# Patient Record
Sex: Male | Born: 1945 | Race: Black or African American | Hispanic: No | Marital: Married | Smoking: Former smoker
Health system: Southern US, Community
[De-identification: ages and names within clinical notes are randomized; demographics above are authoritative.]

## PROBLEM LIST (undated history)

## (undated) DIAGNOSIS — I739 Peripheral vascular disease, unspecified: Secondary | ICD-10-CM

## (undated) DIAGNOSIS — M549 Dorsalgia, unspecified: Secondary | ICD-10-CM

## (undated) DIAGNOSIS — G629 Polyneuropathy, unspecified: Secondary | ICD-10-CM

## (undated) DIAGNOSIS — F419 Anxiety disorder, unspecified: Secondary | ICD-10-CM

## (undated) DIAGNOSIS — Z860100 Personal history of colon polyps, unspecified: Secondary | ICD-10-CM

## (undated) DIAGNOSIS — M1990 Unspecified osteoarthritis, unspecified site: Secondary | ICD-10-CM

## (undated) DIAGNOSIS — I714 Abdominal aortic aneurysm, without rupture, unspecified: Secondary | ICD-10-CM

## (undated) DIAGNOSIS — E785 Hyperlipidemia, unspecified: Secondary | ICD-10-CM

## (undated) DIAGNOSIS — Z8709 Personal history of other diseases of the respiratory system: Secondary | ICD-10-CM

## (undated) DIAGNOSIS — G8929 Other chronic pain: Secondary | ICD-10-CM

## (undated) DIAGNOSIS — R0602 Shortness of breath: Secondary | ICD-10-CM

## (undated) DIAGNOSIS — K219 Gastro-esophageal reflux disease without esophagitis: Secondary | ICD-10-CM

## (undated) DIAGNOSIS — F329 Major depressive disorder, single episode, unspecified: Secondary | ICD-10-CM

## (undated) DIAGNOSIS — R569 Unspecified convulsions: Secondary | ICD-10-CM

## (undated) DIAGNOSIS — J189 Pneumonia, unspecified organism: Secondary | ICD-10-CM

## (undated) DIAGNOSIS — K759 Inflammatory liver disease, unspecified: Secondary | ICD-10-CM

## (undated) DIAGNOSIS — I10 Essential (primary) hypertension: Secondary | ICD-10-CM

## (undated) DIAGNOSIS — F32A Depression, unspecified: Secondary | ICD-10-CM

## (undated) DIAGNOSIS — J439 Emphysema, unspecified: Secondary | ICD-10-CM

## (undated) DIAGNOSIS — J449 Chronic obstructive pulmonary disease, unspecified: Secondary | ICD-10-CM

## (undated) DIAGNOSIS — J45909 Unspecified asthma, uncomplicated: Secondary | ICD-10-CM

## (undated) DIAGNOSIS — M2550 Pain in unspecified joint: Secondary | ICD-10-CM

## (undated) DIAGNOSIS — Z8719 Personal history of other diseases of the digestive system: Secondary | ICD-10-CM

## (undated) DIAGNOSIS — Z8619 Personal history of other infectious and parasitic diseases: Secondary | ICD-10-CM

## (undated) DIAGNOSIS — Z8711 Personal history of peptic ulcer disease: Secondary | ICD-10-CM

## (undated) HISTORY — DX: Abdominal aortic aneurysm, without rupture: I71.4

## (undated) HISTORY — DX: Peripheral vascular disease, unspecified: I73.9

## (undated) HISTORY — DX: Abdominal aortic aneurysm, without rupture, unspecified: I71.40

## (undated) HISTORY — PX: ANKLE SURGERY: SHX546

## (undated) HISTORY — DX: Essential (primary) hypertension: I10

## (undated) HISTORY — DX: Chronic obstructive pulmonary disease, unspecified: J44.9

## (undated) HISTORY — DX: Hyperlipidemia, unspecified: E78.5

## (undated) HISTORY — PX: COLONOSCOPY: SHX174

---

## 1990-01-23 HISTORY — PX: ROTATOR CUFF REPAIR: SHX139

## 1999-07-01 ENCOUNTER — Emergency Department (HOSPITAL_COMMUNITY): Admission: EM | Admit: 1999-07-01 | Discharge: 1999-07-01 | Payer: Self-pay | Admitting: Emergency Medicine

## 1999-07-01 ENCOUNTER — Encounter: Payer: Self-pay | Admitting: Emergency Medicine

## 2006-01-23 DIAGNOSIS — J189 Pneumonia, unspecified organism: Secondary | ICD-10-CM

## 2006-01-23 DIAGNOSIS — Z8709 Personal history of other diseases of the respiratory system: Secondary | ICD-10-CM

## 2006-01-23 HISTORY — DX: Personal history of other diseases of the respiratory system: Z87.09

## 2006-01-23 HISTORY — DX: Pneumonia, unspecified organism: J18.9

## 2006-07-18 ENCOUNTER — Ambulatory Visit: Payer: Self-pay | Admitting: Gastroenterology

## 2006-08-17 ENCOUNTER — Ambulatory Visit: Payer: Self-pay | Admitting: Gastroenterology

## 2006-09-05 ENCOUNTER — Encounter: Payer: Self-pay | Admitting: Gastroenterology

## 2006-09-05 ENCOUNTER — Ambulatory Visit: Payer: Self-pay | Admitting: Gastroenterology

## 2006-09-05 ENCOUNTER — Ambulatory Visit (HOSPITAL_COMMUNITY): Admission: RE | Admit: 2006-09-05 | Discharge: 2006-09-05 | Payer: Self-pay | Admitting: Gastroenterology

## 2009-02-08 ENCOUNTER — Ambulatory Visit: Payer: Self-pay | Admitting: Surgery

## 2010-02-21 ENCOUNTER — Ambulatory Visit
Admission: RE | Admit: 2010-02-21 | Discharge: 2010-02-21 | Payer: Self-pay | Source: Home / Self Care | Attending: Surgery | Admitting: Surgery

## 2010-02-21 ENCOUNTER — Ambulatory Visit: Admit: 2010-02-21 | Payer: Self-pay | Admitting: Surgery

## 2010-02-22 NOTE — Assessment & Plan Note (Signed)
OFFICE VISIT  [name], [name] DOB:  Dec 09, 1945                                       02/21/2010 ZOXWR#:[id number]  The patient comes back in today for follow-up of his abdominal aortic aneurysm.  This was initially detected via MRI.  He denies having any symptoms.  He is complaining of some reflux-type problems as well as some neuropathic pain in his right flank radiating down his right leg but no specific complaints that relate to his aneurysm.  He continues to smoke 1 pack of cigarettes which lasts him 2 weeks.  PHYSICAL EXAMINATIONS:  Heart rate 61, blood pressure 189/97, temperature is 97.  General:  He is well-appearing, in no distress. HEENT:  Within normal limits.  Cardiovascular:  He has palpable pedal pulses.  Abdomen:  Soft, nontender.  No pulsatile mass. Musculoskeletal:  Without major deformities.  Skin:  Without rash.  DIAGNOSTIC STUDIES:  Ultrasound was performed today which reveals a slight increase in the size of his aneurysm.  Two years ago it measured 3.7, this year it measures 4.3.  ASSESSMENT:  Infrarenal abdominal aortic aneurysm.  PLAN:  The patient's aneurysm is growing approximately 3 mm per year.  I will plan on seeing him back in a year.  At that time I would like to get a baseline CT scan to make sure that it correlates with our ultrasound measurements.  In addition, we will get a CT scan of his chest to make sure there is not additional aortic pathology above the diaphragm.  Again, I will plan on seeing him back in the year.    Jorge Ny, MD Electronically Signed  VWB/MEDQ  D:  02/21/2010  T:  02/22/2010  Job:  [id number]  cc:   Lewanda Rife

## 2010-03-07 NOTE — Procedures (Unsigned)
DUPLEX ULTRASOUND OF ABDOMINAL AORTA  INDICATION:  Abdominal aortic aneurysm.  HISTORY: Diabetes:  No Cardiac:  No Hypertension:  Yes Smoking:  Previous Previous Surgery:  No Family History:  No.  DUPLEX EXAM:         AP (cm)                   TRANSVERSE (cm) Proximal             2.9 cm                    2.9 cm Mid                  not visualized            not visualized Distal               4.3 cm                    4.3 cm Right Iliac          not visualized            not visualized Left Iliac           not visualized            not visualized  PREVIOUS:  Date: 12/22/2008 (MRI)  AP:  3.7  TRANSVERSE:  3.7  IMPRESSION: 1. Aneurysmal dilatation of the distal abdominal aorta noted, based on     limited visualization. 2. Mild increase in the maximum diameter measurement of the distal     abdominal aorta noted when compared to the previous MRI. 3. Unable to adequately visualize the mid abdominal aorta and     bilateral common iliac arteries due to overlying bowel gas     patterns.  ___________________________________________ V. Charlena Cross, MD  CH/MEDQ  D:  02/21/2010  T:  02/21/2010  Job:  [id number]
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
## 2010-06-07 NOTE — Assessment & Plan Note (Signed)
OFFICE VISIT   JEYDAN, BARNER  DOB:  02/16/1945                                       02/08/2009  ZOXWR#:[id number]   REFERRING PHYSICIAN:  Ernestine Conrad, MD   REASON FOR VISIT:  Abdominal aortic aneurysm.   HISTORY:  The patient is a 65 year old gentleman I am seeing at the  request of Dr. Ernestine Conrad for evaluation of abdominal aortic aneurysm.  The patient presented with abnormal gait and muscle weakness on the  right side.  He underwent an MRI to evaluate his back and this revealed  a 3.7-cm infrarenal abdominal aortic aneurysm.  It had increased in size  since 2008 where it measured 3.1 cm.  The patient denies having any  symptoms of abdominal pain.  He does complain of some weakness which is  associated with his back pain.  He denies claudication.   The patient has a history of hypertension and hypercholesterolemia which  are medically managed.  He is a former smoker, but quit in June 2009.   REVIEW OF SYSTEMS:  GENERAL:  Negative.  CARDIAC:  Positive shortness of breath.  PULMONARY:  Negative.  GI:  Positive for reflux.  GU:  Negative.  VASCULAR:  Positive for pain in legs with walking and lying flat.  NEURO:  Negative.  MUSCULOSKELETAL:  Positive joint pain.  PSYCH:  Negative.  HEENT:  Negative.  HEMATOLOGIC:  Negative.  SKIN:  Negative.   PAST MEDICAL HISTORY:  Hypertension, hypercholesterolemia, COPD.   PAST SURGICAL HISTORY:  Rotator cuff surgery and cyst removal.   FAMILY HISTORY:  Negative for cardiovascular disease at an early age.   SOCIAL HISTORY:  He has 3 children.  He is disabled.  Does not smoke.  Has a history of smoking and quit in 2009.   ALLERGIES:  SULFA.   PHYSICAL EXAMINATION:  Heart rate 71, blood pressure 157/102, O2 sats  are 99%, temperature is 97.9.  General:  Well-appearing, no distress.  HEENT is normal.  Neck is supple.  No JVD.  Lungs:  Clear bilaterally.  Cardiovascular:  Regular rate and rhythm.  He has  palpable pedal pulses  bilaterally and carotids are without bruit.  Abdomen:  Soft, nontender,  no hepatosplenomegaly.  Musculoskeletal:  Without major deformities or  cyanosis.  Neurologically, he is nonfocal.  Neurologically, he is  intact.  Skin is without rash.  Psychological:  He has a normal affect.   DIAGNOSTIC STUDIES:  The patient comes with a report of his MRI which  shows a 3.7-cm infrarenal abdominal aortic aneurysm.   ASSESSMENT:  Abdominal aortic aneurysm.   PLAN:  Based on the size of the patient's aneurysm, I think he is a very  low risk for rupture.  However, I do think this needs to be followed  over time.  I have scheduled the patient to come back in 6 months for an  abdominal ultrasound and see a PA at that time.  I will plan on seeing  him in 1 year.  When his aorta gets to be greater than 4 cm, I would get  a baseline CT angiogram for future planning.  All of the patient's  questions were answered today.     Jorge Ny, MD  Electronically Signed   VWB/MEDQ  D:  02/08/2009  T:  02/09/2009  Job:  [id number]  cc:   Ernestine Conrad, MD

## 2010-06-07 NOTE — Op Note (Signed)
[name], [name]                  ACCOUNT NO.:  [id number]   MEDICAL RECORD NO.:  [id number]          PATIENT TYPE:  AMB   LOCATION:  DAY                           FACILITY:  [facility]   PHYSICIAN:  Kassie Mends, M.D.      DATE OF BIRTH:  07/09/1945   DATE OF PROCEDURE:  09/05/2006  DATE OF DISCHARGE:                               OPERATIVE REPORT   PROCEDURE:  1. Colonoscopy with cold forceps polypectomy.  2. Esophagogastroduodenoscopy with cold forceps biopsy.   INDICATION FOR EXAM:  Mr. Truss is a 65 year old male who has a  personal history of polyps.  His last colonoscopy showed an adenomatous  polyp with high grade dysplasia in 2002.  He is also complaining of pain  in his abdomen in the epigastric region.  He denies any vomiting.  He  has also had a 12-pound weight loss.   FINDINGS:  1. 4 mm transverse colon polyp removed via cold forceps.  4 mm cecal      polyp removed via cold forceps.  2. Pan colonic diverticulosis, left side predominant.  Otherwise no      masses, inflammatory changes or AVMs seen.  3. Normal retroflexed view of the rectum.  4. Normal esophagus without evidence of Barrett's erosions,      ulceration, or mass.  5. Sliding hiatal hernia.  Diffuse antral erythema without erosion or      ulceration.  Biopsies obtained via cold forceps to evaluate for H      pylori gastritis.  6. Normal duodenal bulb and second portion of duodenum.   RECOMMENDATIONS:  1. Mr. Sebesta should avoid gastric irritants.  He is given a handout      on gastritis as well as gastric irritants.  2. Screening colonoscopy in 5 years.  3. No aspirin, NSAIDs or anticoagulation for 5 days.  4. He should follow high fiber diet.  He is given a handout on high-      fiber diet, constipation, polyps, diverticulosis, gastritis and      gastric irritants.  5. Will call Mr. Wisenbaker with the results of his biopsies.   MEDICATIONS:  1. Demerol 125 mg IV.  2. Versed 7 mg IV.  3. Phenergan 25 mg  IV.   PROCEDURE TECHNIQUE:  Physical exam was performed.  Informed consent was  obtained from the patient after explaining the benefits, risks and  alternatives to the procedure.  The patient connected to monitor placed  in left lateral position.  Continuous oxygen was provided by nasal  cannula, IV medicine administered through an indwelling cannula.  After  administration of sedation and rectal exam, the patient's rectum was  intubated.  The scope was advanced under direct visualization to the  cecum.  The scope was removed slowly by carefully examining the color,  texture, anatomy and integrity mucosa on the way out.   After the colonoscopy, the patient's esophagus intubated with a  diagnostic gastroscope and advanced under direct visualization to second  portion of duodenum.  Scope was removed slowly by carefully examine the  color, texture, anatomy and  integrity mucosa on the way out.  The  patient needed to be recovered in PACU due to continued sedation after  the procedure.  However, during the upper endoscopy and colonoscopy, he  was extremely agitated.  The patient was fully recovered and discharged  home in satisfactory condition.      Kassie Mends, M.D.  Electronically Signed     SM/MEDQ  D:  09/05/2006  T:  09/06/2006  Job:  [id number]   cc:   Selinda Flavin  Fax: [phone]

## 2010-06-07 NOTE — Assessment & Plan Note (Signed)
NAMEMarland Kitchen  FREDERICK, MARRO                   CHART#:  [id number]   DATE:  08/17/2006                       DOB:  [birth year]   PROBLEM LIST:  1. Abdominal pain and constipation.  2. Personal history of polyps in 2002.  3. 3.1 cm abdominal aneurysm, infrarenal.  4. Chronic obstructive pulmonary disease.  5. Anxiety.  6. Back pain.  7. Insomnia.  8. 3mm lung nodule   SUBJECTIVE:  Mr. Senkbeil is a 65 year old male who is seen today as a  return patient visit. He states that he is feeling pretty good and  eating better. He is going to the bathroom better. His heartburn is  controlled with Nexium. He is only having heartburn when he cheats. His  difficulty urinating is resolved with Proscar. His abdominal pain is  improved. He also gained 5.5 pounds since his initial visit on  07/18/2006.   MEDICATIONS:  1. Xanax 1 mg q.i.d.  2. Finasteride 5 mg daily.  3. Diazepam 10 mg 1.5 nightly.  4. Nexium 40 mg daily.  5. Duragesic patch 25 mcg every 72 hours.  6. Advair.  7. Gabapentin 300 mg t.i.d.  8. MiraLax once weekly.  9. Metamucil 3 times daily.   OBJECTIVE:  VITAL SIGNS:  Weight 156 pounds, height 5 foot 11 inches,  BMI 21.8 (healthy), temperature 98.3, blood pressure 102/78, pulse 64.  GENERAL:  He is in no apparent distress. He is awake, alert, and  oriented x4.  LUNGS:  Clear to auscultation bilaterally.  CARDIOVASCULAR:  Regular rhythm with no murmur. Normal S1 and S2.  ABDOMEN:  Bowel sounds are present. Soft and nontender, nondistended. No  rebound or guarding.  EXTREMITIES:  Without cyanosis, clubbing, or edema.   LABORATORY DATA:  July 23, 2006: BUN 11, creatinine 1.04, alkaline  phosphatase 143, total bilirubin 0.6, AST 16, ALT 14, white count 6.6,  hemoglobin 13.5, AFP less than 1, lipase 15.   CT scan performed on June 30 at [facility] revealed no  acute intraabdominal findings. He had a few hepatic and renal cysts. He  is also known to have a 3.1 cm  abdominal aneurysm. He also had a 3 mm  nodule on the right base.   ASSESSMENT:  Mr. Fehringer is a 65 year old male who has abdominal pain  most likely secondary to his constipation. His gastroesophageal reflux  disease is now controlled on Nexium. He has a personal history of  polyps. He has a lung nodule on CT Scan that needs to be followed  yearly.   Thank you for allowing me to see Mr. Kliethermes in consultation. My  recommendations follow.   RECOMMENDATIONS:  1. We will schedule Mr. Crutchfield for a colonoscopy within the next 2      weeks with EZ prep.  2. He should continue his Nexium 40 mg daily.  3. He should continue his MiraLax and his Metamucil for his      constipation.  4. He will be given a referral to a vascular surgeon for his abdominal      aortic aneurysm.  5. Radiology recommended a follow up CT scan of the chest in one year.       Kassie Mends, M.D.  Electronically Signed     SM/MEDQ  D:  08/17/2006  T:  08/18/2006  Job:  [id number]   cc:   Selinda Flavin

## 2010-11-21 ENCOUNTER — Other Ambulatory Visit: Payer: Self-pay | Admitting: *Deleted

## 2010-11-21 DIAGNOSIS — I714 Abdominal aortic aneurysm, without rupture: Secondary | ICD-10-CM

## 2010-11-21 DIAGNOSIS — I7092 Chronic total occlusion of artery of the extremities: Secondary | ICD-10-CM

## 2011-02-22 ENCOUNTER — Encounter: Payer: Self-pay | Admitting: Surgery

## 2011-02-27 ENCOUNTER — Inpatient Hospital Stay: Admission: RE | Admit: 2011-02-27 | Payer: Self-pay | Source: Ambulatory Visit

## 2011-02-27 ENCOUNTER — Ambulatory Visit: Payer: Self-pay | Admitting: Surgery

## 2011-02-27 ENCOUNTER — Other Ambulatory Visit: Payer: Self-pay

## 2011-03-01 ENCOUNTER — Encounter: Payer: Self-pay | Admitting: Family Medicine

## 2011-03-17 ENCOUNTER — Encounter: Payer: Self-pay | Admitting: Surgery

## 2011-03-20 ENCOUNTER — Ambulatory Visit
Admission: RE | Admit: 2011-03-20 | Discharge: 2011-03-20 | Disposition: A | Discharge status: Home / Self Care | Payer: No Typology Code available for payment source | Source: Ambulatory Visit | Attending: Surgery | Admitting: Surgery

## 2011-03-20 ENCOUNTER — Other Ambulatory Visit: Payer: Self-pay | Admitting: Surgery

## 2011-03-20 ENCOUNTER — Encounter: Payer: Self-pay | Admitting: Surgery

## 2011-03-20 ENCOUNTER — Ambulatory Visit (INDEPENDENT_AMBULATORY_CARE_PROVIDER_SITE_OTHER): Payer: Medicare Other | Admitting: Surgery

## 2011-03-20 VITALS — BP 167/98 | HR 70 | Resp 16 | Ht 71.0 in | Wt 172.0 lb

## 2011-03-20 DIAGNOSIS — I714 Abdominal aortic aneurysm, without rupture, unspecified: Secondary | ICD-10-CM | POA: Insufficient documentation

## 2011-03-20 DIAGNOSIS — I7092 Chronic total occlusion of artery of the extremities: Secondary | ICD-10-CM

## 2011-03-20 MED ORDER — IOHEXOL 350 MG/ML SOLN
100.0000 mL | Freq: Once | INTRAVENOUS | Status: AC | PRN
  Administered 2011-03-20: 100 mL via INTRAVENOUS

## 2011-03-20 NOTE — Progress Notes (Signed)
Addended by: Holley Raring on: 03/20/2011 02:02 PM   Modules accepted: Orders

## 2011-03-20 NOTE — Progress Notes (Signed)
[facility]   Patient name: [name]. MRN: [id number] DOB: [birth year] Sex: male     Chief Complaint  Patient presents with  . AAA    one year f/up -Abdominal  discomfort duration6 month    HISTORY OF PRESENT ILLNESS: The patient comes back today for followup of his abdominal aortic aneurysm. This was initially detected by MRI when he was being evaluated for an abnormal gait. Last year it measured 4.3 cm. 2 years prior it measured 3.7 cm. He denies symptoms of abdominal or back pain. He denies neurologic symptoms of numbness or weakness in either extremity or amaurosis or slurred speech. He continues to suffer from his COPD and has been changing his inhalers around. He seems to be stable at this time.  Past Medical History  Diagnosis Date  . Hypertension   . Hyperlipidemia   . COPD (chronic obstructive pulmonary disease)   . Peripheral vascular disease   . AAA (abdominal aortic aneurysm)     Past Surgical History  Procedure Date  . Rotator cuff repair     History   Social History  . Marital Status: Married    Spouse Name: N/A    Number of Children: N/A  . Years of Education: N/A   Occupational History  . Not on file.   Social History Main Topics  . Smoking status: Former Smoker    Quit date: 06/24/2007  . Smokeless tobacco: Not on file  . Alcohol Use: No  . Drug Use: No  . Sexually Active:    Other Topics Concern  . Not on file   Social History Narrative  . No narrative on file    History reviewed. No pertinent family history.  Allergies as of 03/20/2011 - Review Complete 03/20/2011  Allergen Reaction Noted  . Sulfa antibiotics  02/22/2011    Current Outpatient Prescriptions on File Prior to Visit  Medication Sig Dispense Refill  . doxazosin (CARDURA) 4 MG tablet Take 4 mg by mouth at bedtime.      Marland Kitchen esomeprazole (NEXIUM) 40 MG capsule Take 40 mg by mouth daily before breakfast.      . Fluticasone-Salmeterol (ADVAIR)  100-50 MCG/DOSE AEPB Inhale 1 puff into the lungs every 12 (twelve) hours.      . gabapentin (NEURONTIN) 300 MG capsule Take 300 mg by mouth 3 (three) times daily.      Marland Kitchen lisinopril (PRINIVIL,ZESTRIL) 10 MG tablet Take 10 mg by mouth daily.      Marland Kitchen loratadine (CLARITIN) 10 MG tablet Take 10 mg by mouth daily.      . simvastatin (ZOCOR) 20 MG tablet Take 20 mg by mouth every evening.       No current facility-administered medications on file prior to visit.     REVIEW OF SYSTEMS: Occasional shortness of breath, chest pressure, pain in legs with walking and lying flat, productive cough, asthma, wheezing. All other review of systems are negative. PHYSICAL EXAMINATION:   Vital signs are BP 167/98  Pulse 70  Resp 16  Ht 5\' 11"  (1.803 m)  Wt 172 lb (78.019 kg)  BMI 23.99 kg/m2  SpO2 96% General: The patient appears their stated age. HEENT:  No gross abnormalities Pulmonary:  Non labored breathing Abdomen: Soft and non-tender no pulsatile mass tenderness. Musculoskeletal: There are no major deformities. Neurologic: No focal weakness or paresthesias are detected, Skin: There are no ulcer or rashes noted. Psychiatric: The patient has normal affect. Cardiovascular: There is a regular  rate and rhythm without significant murmur appreciated. No carotid bruits. Palpable posterior tibial pulse bilaterally.   Diagnostic Studies CT angiogram was reviewed. This shows a 4.5 cm infrarenal abdominal aortic aneurysm. Of note a 4 mm nodule in the right middle lobe was identified. Recommendations are for  followup in one year  Assessment: Abdominal aortic aneurysm Plan: I'll continue to follow the patient on a 6 month basis. He'll get an ultrasound in 6 months oh as well as carotid duplex. He will need a CT scan repeated in one year to evaluate his lung nodule. L4 this to his primary care physician to assist with following this. I am ordering his CAT scan of the chest today. This will be one year from  now  V. Charlena Cross, M.D. [facility] [place] Office: [phone] Pager:  [phone]

## 2011-07-03 ENCOUNTER — Other Ambulatory Visit: Payer: Self-pay | Admitting: *Deleted

## 2011-07-03 DIAGNOSIS — Z01810 Encounter for preprocedural cardiovascular examination: Secondary | ICD-10-CM

## 2011-07-03 DIAGNOSIS — I714 Abdominal aortic aneurysm, without rupture: Secondary | ICD-10-CM

## 2011-08-09 ENCOUNTER — Encounter: Payer: Self-pay | Admitting: Gastroenterology

## 2011-09-18 ENCOUNTER — Ambulatory Visit: Payer: No Typology Code available for payment source | Admitting: Surgery

## 2011-09-18 ENCOUNTER — Other Ambulatory Visit: Payer: No Typology Code available for payment source

## 2011-09-29 ENCOUNTER — Encounter: Payer: Self-pay | Admitting: Surgery

## 2011-10-02 ENCOUNTER — Ambulatory Visit (INDEPENDENT_AMBULATORY_CARE_PROVIDER_SITE_OTHER): Payer: Medicare Other | Admitting: Vascular Surgery

## 2011-10-02 ENCOUNTER — Encounter: Payer: Self-pay | Admitting: Surgery

## 2011-10-02 ENCOUNTER — Ambulatory Visit (INDEPENDENT_AMBULATORY_CARE_PROVIDER_SITE_OTHER): Payer: Medicare Other | Admitting: Surgery

## 2011-10-02 VITALS — BP 133/92 | HR 60 | Ht 71.0 in | Wt 160.0 lb

## 2011-10-02 DIAGNOSIS — Z01810 Encounter for preprocedural cardiovascular examination: Secondary | ICD-10-CM

## 2011-10-02 DIAGNOSIS — I714 Abdominal aortic aneurysm, without rupture: Secondary | ICD-10-CM

## 2011-10-02 DIAGNOSIS — I6529 Occlusion and stenosis of unspecified carotid artery: Secondary | ICD-10-CM

## 2011-10-02 DIAGNOSIS — R911 Solitary pulmonary nodule: Secondary | ICD-10-CM

## 2011-10-02 NOTE — Progress Notes (Signed)
Abdominal aorta duplex performed @ [facility] 10/02/2011

## 2011-10-02 NOTE — Addendum Note (Signed)
Addended by: Sharee Pimple on: 10/02/2011 02:44 PM   Modules accepted: Orders

## 2011-10-02 NOTE — Progress Notes (Signed)
[facility] and [facility]ist of [facility]   Patient name: [name]. MRN: [id number] DOB: Mar 06, 1945 Sex: male     Chief Complaint  Patient presents with  . Carotid    6 month f/u   . AAA    HISTORY OF PRESENT ILLNESS: The patient comes back today for followup of his abdominal aortic aneurysm. This was initially detected by MRI. He denies having abdominal symptoms. He continues to smoke. He has no particular complaints today  Past Medical History  Diagnosis Date  . Hypertension   . Hyperlipidemia   . COPD (chronic obstructive pulmonary disease)   . Peripheral vascular disease   . AAA (abdominal aortic aneurysm)     Past Surgical History  Procedure Date  . Rotator cuff repair     History   Social History  . Marital Status: Married    Spouse Name: N/A    Number of Children: N/A  . Years of Education: N/A   Occupational History  . Not on file.   Social History Main Topics  . Smoking status: Current Some Day Smoker    Types: Cigarettes  . Smokeless tobacco: Never Used   Comment: pt states that he only smokes about 2-3 cigs per day  . Alcohol Use: No  . Drug Use: No  . Sexually Active: Not on file   Other Topics Concern  . Not on file   Social History Narrative  . No narrative on file    Family History  Problem Relation Age of Onset  . Hypertension Mother   . Heart disease Father   . Heart attack Father   . Diabetes Sister     Allergies as of 10/02/2011 - Review Complete 10/02/2011  Allergen Reaction Noted  . Sulfa antibiotics  02/22/2011    Current Outpatient Prescriptions on File Prior to Visit  Medication Sig Dispense Refill  . COMBIVENT RESPIMAT 20-100 MCG/ACT AERS respimat Inhale into the lungs as needed.      . doxazosin (CARDURA) 4 MG tablet Take 4 mg by mouth at bedtime.      Marland Kitchen esomeprazole (NEXIUM) 40 MG capsule Take 40 mg by mouth daily before breakfast.      . Fluticasone-Salmeterol (ADVAIR) 100-50 MCG/DOSE AEPB Inhale 1 puff into  the lungs every 12 (twelve) hours.      . gabapentin (NEURONTIN) 300 MG capsule Take 300 mg by mouth 3 (three) times daily.      Marland Kitchen lisinopril (PRINIVIL,ZESTRIL) 10 MG tablet Take 10 mg by mouth daily.      Marland Kitchen loratadine (CLARITIN) 10 MG tablet Take 10 mg by mouth daily.      . simvastatin (ZOCOR) 20 MG tablet Take 20 mg by mouth every evening.         REVIEW OF SYSTEMS: No change from prior visit  PHYSICAL EXAMINATION:   Vital signs are BP 133/92  Pulse 60  Ht 5\' 11"  (1.803 m)  Wt 160 lb (72.576 kg)  BMI 22.32 kg/m2  SpO2 100% General: The patient appears their stated age. HEENT:  No gross abnormalities Pulmonary:  Non labored breathing Abdomen: Soft and non-tender. Aorta is palpable and nontender. Musculoskeletal: There are no major deformities. Neurologic: No focal weakness or paresthesias are detected, Skin: There are no ulcer or rashes noted. Psychiatric: The patient has normal affect. Cardiovascular: There is a regular rate and rhythm without significant murmur appreciated. No carotid bruits   Diagnostic Studies Duplex ultrasound studies were reviewed today:  Carotid ultrasound: 1-39% stenosis bilaterally  Abdominal ultrasound: 4.3 x 4.2 abdominal aortic aneurysm    Assessment: Abdominal aortic aneurysm Plan: The patient will continue with surveillance ultrasound in 6 months. I have also ordered a CT scan of his chest in 6 months to further evaluate a midline nodule that was detected. The patient is aware of this. I was see him after his studies in 6 months.  Jorge Ny, M.D. [facility] [place] Office: [phone] Pager:  [phone]

## 2011-10-02 NOTE — Progress Notes (Signed)
Carotid duplex performed @ [facility] 10/02/2011

## 2012-04-01 ENCOUNTER — Ambulatory Visit
Admission: RE | Admit: 2012-04-01 | Discharge: 2012-04-01 | Disposition: A | Discharge status: Home / Self Care | Payer: Medicare Other | Source: Ambulatory Visit | Attending: Surgery | Admitting: Surgery

## 2012-04-01 ENCOUNTER — Encounter: Payer: Self-pay | Admitting: Surgery

## 2012-04-01 ENCOUNTER — Ambulatory Visit (INDEPENDENT_AMBULATORY_CARE_PROVIDER_SITE_OTHER): Payer: Medicare Other | Admitting: Surgery

## 2012-04-01 ENCOUNTER — Encounter (INDEPENDENT_AMBULATORY_CARE_PROVIDER_SITE_OTHER): Payer: Medicare Other | Admitting: *Deleted

## 2012-04-01 VITALS — BP 152/87 | HR 52 | Resp 16 | Ht 70.0 in | Wt 167.0 lb

## 2012-04-01 DIAGNOSIS — I714 Abdominal aortic aneurysm, without rupture: Secondary | ICD-10-CM

## 2012-04-01 DIAGNOSIS — R911 Solitary pulmonary nodule: Secondary | ICD-10-CM

## 2012-04-01 NOTE — Addendum Note (Signed)
Addended by: Sharee Pimple on: 04/01/2012 01:38 PM   Modules accepted: Orders

## 2012-04-01 NOTE — Progress Notes (Signed)
Vascular and Vein Specialist of [facility]   Patient name: [name]. MRN: [id number] DOB: 18-Nov-1945 Sex: male     Chief Complaint  Patient presents with  . AAA    6 month f/up with vascular lab study.    HISTORY OF PRESENT ILLNESS: The patient comes back today for followup of his abdominal aortic aneurysm. This was initially detected by MRI. He denies having abdominal symptoms. He continues to smoke. He has no particular complaints today. He is being followed for a lung nodule, identified by CT scan. He denies abdominal or back pain.   Past Medical History  Diagnosis Date  . Hypertension   . Hyperlipidemia   . COPD (chronic obstructive pulmonary disease)   . Peripheral vascular disease   . AAA (abdominal aortic aneurysm)     Past Surgical History  Procedure Laterality Date  . Rotator cuff repair  1992    Right shoulder    History   Social History  . Marital Status: Married    Spouse Name: N/A    Number of Children: N/A  . Years of Education: N/A   Occupational History  . Not on file.   Social History Main Topics  . Smoking status: Current Some Day Smoker    Types: Cigarettes  . Smokeless tobacco: Never Used     Comment: pt states that he only smokes about 2-3 cigs per day  . Alcohol Use: No  . Drug Use: No  . Sexually Active: Not on file   Other Topics Concern  . Not on file   Social History Narrative  . No narrative on file    Family History  Problem Relation Age of Onset  . Hypertension Mother   . Heart disease Father   . Heart attack Father   . Diabetes Sister     Allergies as of 04/01/2012 - Review Complete 04/01/2012  Allergen Reaction Noted  . Sulfa antibiotics Hives 02/22/2011    Current Outpatient Prescriptions on File Prior to Visit  Medication Sig Dispense Refill  . doxazosin (CARDURA) 4 MG tablet Take 4 mg by mouth at bedtime.      . gabapentin (NEURONTIN) 300 MG capsule Take 300 mg by mouth 3 (three) times daily.      .  predniSONE (DELTASONE) 20 MG tablet Take by mouth as needed.      . simvastatin (ZOCOR) 20 MG tablet Take 20 mg by mouth every evening.      Marland Kitchen ADVAIR DISKUS 250-50 MCG/DOSE AEPB Inhale into the lungs as needed.      . COMBIVENT RESPIMAT 20-100 MCG/ACT AERS respimat Inhale into the lungs as needed.      Marland Kitchen esomeprazole (NEXIUM) 40 MG capsule Take 40 mg by mouth daily before breakfast.      . Fluticasone-Salmeterol (ADVAIR) 100-50 MCG/DOSE AEPB Inhale 1 puff into the lungs every 12 (twelve) hours.      Marland Kitchen lisinopril (PRINIVIL,ZESTRIL) 10 MG tablet Take 10 mg by mouth daily.      Marland Kitchen loratadine (CLARITIN) 10 MG tablet Take 10 mg by mouth daily.       No current facility-administered medications on file prior to visit.     REVIEW OF SYSTEMS: Positive for shortness of breath with exertion. All other systems are negative as documented by the patient in the encounter form.  PHYSICAL EXAMINATION:   Vital signs are BP 152/87  Pulse 52  Resp 16  Ht 5\' 10"  (1.778 m)  Wt 167 lb (75.751 kg)  BMI 23.96 kg/m2  SpO2 100% General: The patient appears their stated age. HEENT:  No gross abnormalities Pulmonary:  Non labored breathing Abdomen: Soft and non-tender. No pulsatile mass appreciated Musculoskeletal: There are no major deformities. Neurologic: No focal weakness or paresthesias are detected, Skin: There are no ulcer or rashes noted. Psychiatric: The patient has normal affect. Cardiovascular: There is a regular rate and rhythm without significant murmur appreciated.   Diagnostic Studies Abdominal ultrasound: This was ordered and reviewed today. Aneurysm diameter has not changed significantly. It measures 4.2 x 4.3 today.  CT scan chest: Stable bilateral pulmonary nodules. Recommendation is for followup in 6 months with a repeat CAT scan  Assessment: Abdominal aortic aneurysm Plan: The patient has not had a significant change in the size of his aneurysm over the past 6 months. I  recommending followed with abdominal ultrasound every 6 months. I am also following a pulmonary nodule. CT scan of the chest today showed no significant changes. Recommendation is for followup in 6 months. I discussed these findings with the patient. He'll have his CT scan of the chest repeated in 6 months. I'll see him back at that time.  Jorge Ny, M.D. [facility] [place] Office: [phone] Pager:  [phone]

## 2012-05-02 ENCOUNTER — Encounter (HOSPITAL_COMMUNITY): Payer: Self-pay | Admitting: Pharmacy Technician

## 2012-05-02 ENCOUNTER — Other Ambulatory Visit: Payer: Self-pay

## 2012-05-02 ENCOUNTER — Encounter (HOSPITAL_COMMUNITY)
Admission: RE | Admit: 2012-05-02 | Discharge: 2012-05-02 | Disposition: A | Discharge status: Home / Self Care | Payer: Medicare Other | Source: Ambulatory Visit | Attending: Ophthalmology | Admitting: Ophthalmology

## 2012-05-02 ENCOUNTER — Encounter (HOSPITAL_COMMUNITY): Payer: Self-pay

## 2012-05-02 HISTORY — DX: Unspecified osteoarthritis, unspecified site: M19.90

## 2012-05-02 HISTORY — DX: Inflammatory liver disease, unspecified: K75.9

## 2012-05-02 HISTORY — DX: Unspecified convulsions: R56.9

## 2012-05-02 HISTORY — DX: Polyneuropathy, unspecified: G62.9

## 2012-05-02 LAB — BASIC METABOLIC PANEL
BUN: 18 mg/dL (ref 6–23)
Calcium: 9.6 mg/dL (ref 8.4–10.5)
Creatinine, Ser: 0.94 mg/dL (ref 0.50–1.35)
GFR calc Af Amer: >90 mL/min (ref >90)
GFR calc non Af Amer: 85 mL/min — ABNORMAL LOW (ref >90)
Glucose, Bld: 112 mg/dL — ABNORMAL HIGH (ref 70–99)

## 2012-05-02 NOTE — Patient Instructions (Addendum)
Your procedure is scheduled on: 05/06/2012  Report to [facility] at 1300   PM.  Call this number if you have problems the morning of surgery: [phone]   Do not eat food or drink liquids :After Midnight.      Take these medicines the morning of surgery with A SIP OF WATER:lisinopril,prilosec,prednisone,cardura,naeurontin,clariti. Take advair and combivent before you come.   Do not wear jewelry, make-up or nail polish.  Do not wear lotions, powders, or perfumes.   Do not shave 48 hours prior to surgery.  Do not bring valuables to the hospital.  Contacts, dentures or bridgework may not be worn into surgery.  Leave suitcase in the car. After surgery it may be brought to your room.  For patients admitted to the hospital, checkout time is 11:00 AM the day of discharge.   Patients discharged the day of surgery will not be allowed to drive home.  :     Please read over the following fact sheets that you were given: Coughing and Deep Breathing, Surgical Site Infection Prevention, Anesthesia Post-op Instructions and Care and Recovery After Surgery    Cataract A cataract is a clouding of the lens of the eye. When a lens becomes cloudy, vision is reduced based on the degree and nature of the clouding. Many cataracts reduce vision to some degree. Some cataracts make people more near-sighted as they develop. Other cataracts increase glare. Cataracts that are ignored and become worse can sometimes look white. The white color can be seen through the pupil. CAUSES   Aging. However, cataracts may occur at any age, even in newborns.   Certain drugs.   Trauma to the eye.   Certain diseases such as diabetes.   Specific eye diseases such as chronic inflammation inside the eye or a sudden attack of a rare form of glaucoma.   Inherited or acquired medical problems.  SYMPTOMS   Gradual, progressive drop in vision in the affected eye.   Severe, rapid visual loss. This most often happens when trauma  is the cause.  DIAGNOSIS  To detect a cataract, an eye doctor examines the lens. Cataracts are best diagnosed with an exam of the eyes with the pupils enlarged (dilated) by drops.  TREATMENT  For an early cataract, vision may improve by using different eyeglasses or stronger lighting. If that does not help your vision, surgery is the only effective treatment. A cataract needs to be surgically removed when vision loss interferes with your everyday activities, such as driving, reading, or watching TV. A cataract may also have to be removed if it prevents examination or treatment of another eye problem. Surgery removes the cloudy lens and usually replaces it with a substitute lens (intraocular lens, IOL).  At a time when both you and your doctor agree, the cataract will be surgically removed. If you have cataracts in both eyes, only one is usually removed at a time. This allows the operated eye to heal and be out of danger from any possible problems after surgery (such as infection or poor wound healing). In rare cases, a cataract may be doing damage to your eye. In these cases, your caregiver may advise surgical removal right away. The vast majority of people who have cataract surgery have better vision afterward. HOME CARE INSTRUCTIONS  If you are not planning surgery, you may be asked to do the following:  Use different eyeglasses.   Use stronger or brighter lighting.   Ask your eye doctor about reducing your  medicine dose or changing medicines if it is thought that a medicine caused your cataract. Changing medicines does not make the cataract go away on its own.   Become familiar with your surroundings. Poor vision can lead to injury. Avoid bumping into things on the affected side. You are at a higher risk for tripping or falling.   Exercise extreme care when driving or operating machinery.   Wear sunglasses if you are sensitive to bright light or experiencing problems with glare.  SEEK  IMMEDIATE MEDICAL CARE IF:   You have a worsening or sudden vision loss.   You notice redness, swelling, or increasing pain in the eye.   You have a fever.  Document Released: 01/09/2005 Document Revised: 12/29/2010 Document Reviewed: 09/02/2010 [facility] Patient Information 2012 [place], [place].PATIENT INSTRUCTIONS POST-ANESTHESIA  IMMEDIATELY FOLLOWING SURGERY:  Do not drive or operate machinery for the first twenty four hours after surgery.  Do not make any important decisions for twenty four hours after surgery or while taking narcotic pain medications or sedatives.  If you develop intractable nausea and vomiting or a severe headache please notify your doctor immediately.  FOLLOW-UP:  Please make an appointment with your surgeon as instructed. You do not need to follow up with anesthesia unless specifically instructed to do so.  WOUND CARE INSTRUCTIONS (if applicable):  Keep a dry clean dressing on the anesthesia/puncture wound site if there is drainage.  Once the wound has quit draining you may leave it open to air.  Generally you should leave the bandage intact for twenty four hours unless there is drainage.  If the epidural site drains for more than 36-48 hours please call the anesthesia department.  QUESTIONS?:  Please feel free to call your physician or the hospital operator if you have any questions, and they will be happy to assist you.

## 2012-05-03 MED ORDER — CYCLOPENTOLATE-PHENYLEPHRINE 0.2-1 % OP SOLN
OPHTHALMIC | Status: AC
  Filled 2012-05-03: qty 2

## 2012-05-03 MED ORDER — NEOMYCIN-POLYMYXIN-DEXAMETH 3.5-10000-0.1 OP OINT
TOPICAL_OINTMENT | OPHTHALMIC | Status: AC
  Filled 2012-05-03: qty 3.5

## 2012-05-03 MED ORDER — PHENYLEPHRINE HCL 2.5 % OP SOLN
OPHTHALMIC | Status: AC
  Filled 2012-05-03: qty 2

## 2012-05-03 MED ORDER — LIDOCAINE HCL (PF) 1 % IJ SOLN
INTRAMUSCULAR | Status: AC
  Filled 2012-05-03: qty 2

## 2012-05-03 MED ORDER — LIDOCAINE HCL 3.5 % OP GEL
OPHTHALMIC | Status: AC
  Filled 2012-05-03: qty 5

## 2012-05-03 MED ORDER — TETRACAINE HCL 0.5 % OP SOLN
OPHTHALMIC | Status: AC
  Filled 2012-05-03: qty 2

## 2012-05-06 ENCOUNTER — Ambulatory Visit (HOSPITAL_COMMUNITY): Payer: Medicare Other | Admitting: Anesthesiology

## 2012-05-06 ENCOUNTER — Encounter (HOSPITAL_COMMUNITY)
Admission: RE | Disposition: A | Discharge status: Home / Self Care | Payer: Self-pay | Source: Ambulatory Visit | Attending: Ophthalmology

## 2012-05-06 ENCOUNTER — Encounter (HOSPITAL_COMMUNITY): Payer: Self-pay | Admitting: Anesthesiology

## 2012-05-06 ENCOUNTER — Ambulatory Visit (HOSPITAL_COMMUNITY)
Admission: RE | Admit: 2012-05-06 | Discharge: 2012-05-06 | Disposition: A | Discharge status: Home / Self Care | Payer: Medicare Other | Source: Ambulatory Visit | Attending: Ophthalmology | Admitting: Ophthalmology

## 2012-05-06 ENCOUNTER — Encounter (HOSPITAL_COMMUNITY): Payer: Self-pay | Admitting: *Deleted

## 2012-05-06 DIAGNOSIS — Z01812 Encounter for preprocedural laboratory examination: Secondary | ICD-10-CM | POA: Insufficient documentation

## 2012-05-06 DIAGNOSIS — J449 Chronic obstructive pulmonary disease, unspecified: Secondary | ICD-10-CM | POA: Insufficient documentation

## 2012-05-06 DIAGNOSIS — Z01810 Encounter for preprocedural cardiovascular examination: Secondary | ICD-10-CM | POA: Insufficient documentation

## 2012-05-06 DIAGNOSIS — J4489 Other specified chronic obstructive pulmonary disease: Secondary | ICD-10-CM | POA: Insufficient documentation

## 2012-05-06 DIAGNOSIS — H2589 Other age-related cataract: Secondary | ICD-10-CM | POA: Insufficient documentation

## 2012-05-06 HISTORY — PX: CATARACT EXTRACTION W/PHACO: SHX586

## 2012-05-06 SURGERY — PHACOEMULSIFICATION, CATARACT, WITH IOL INSERTION
Anesthesia: Monitor Anesthesia Care | Site: Eye | Laterality: Right | Wound class: Clean

## 2012-05-06 MED ORDER — LIDOCAINE HCL (PF) 1 % IJ SOLN
INTRAOCULAR | Status: DC | PRN
  Administered 2012-05-06: 14:00:00 via OPHTHALMIC

## 2012-05-06 MED ORDER — EPINEPHRINE HCL 1 MG/ML IJ SOLN
INTRAMUSCULAR | Status: AC
  Filled 2012-05-06: qty 1

## 2012-05-06 MED ORDER — LIDOCAINE HCL 3.5 % OP GEL
1.0000 "application " | Freq: Once | OPHTHALMIC | Status: AC
  Administered 2012-05-06: 1 via OPHTHALMIC

## 2012-05-06 MED ORDER — POVIDONE-IODINE 5 % OP SOLN
OPHTHALMIC | Status: DC | PRN
  Administered 2012-05-06: 1 via OPHTHALMIC

## 2012-05-06 MED ORDER — PHENYLEPHRINE HCL 2.5 % OP SOLN
1.0000 [drp] | OPHTHALMIC | Status: AC
  Administered 2012-05-06 (×3): 1 [drp] via OPHTHALMIC

## 2012-05-06 MED ORDER — BSS IO SOLN
INTRAOCULAR | Status: DC | PRN
  Administered 2012-05-06: 15 mL via INTRAOCULAR

## 2012-05-06 MED ORDER — PROVISC 10 MG/ML IO SOLN
INTRAOCULAR | Status: DC | PRN
  Administered 2012-05-06: 8.5 mg via INTRAOCULAR

## 2012-05-06 MED ORDER — TETRACAINE HCL 0.5 % OP SOLN
1.0000 [drp] | OPHTHALMIC | Status: AC
  Administered 2012-05-06 (×3): 1 [drp] via OPHTHALMIC

## 2012-05-06 MED ORDER — NEOMYCIN-POLYMYXIN-DEXAMETH 0.1 % OP OINT
TOPICAL_OINTMENT | OPHTHALMIC | Status: DC | PRN
  Administered 2012-05-06: 1 via OPHTHALMIC

## 2012-05-06 MED ORDER — LACTATED RINGERS IV SOLN
INTRAVENOUS | Status: DC
  Administered 2012-05-06: 1000 mL via INTRAVENOUS

## 2012-05-06 MED ORDER — MIDAZOLAM HCL 2 MG/2ML IJ SOLN
1.0000 mg | INTRAMUSCULAR | Status: DC | PRN
  Administered 2012-05-06: 2 mg via INTRAVENOUS

## 2012-05-06 MED ORDER — LIDOCAINE 3.5 % OP GEL OPTIME - NO CHARGE
OPHTHALMIC | Status: DC | PRN
  Administered 2012-05-06: 1 [drp] via OPHTHALMIC

## 2012-05-06 MED ORDER — CYCLOPENTOLATE-PHENYLEPHRINE 0.2-1 % OP SOLN
1.0000 [drp] | OPHTHALMIC | Status: AC
  Administered 2012-05-06 (×3): 1 [drp] via OPHTHALMIC

## 2012-05-06 MED ORDER — MIDAZOLAM HCL 2 MG/2ML IJ SOLN
INTRAMUSCULAR | Status: AC
  Filled 2012-05-06: qty 2

## 2012-05-06 MED ORDER — EPINEPHRINE HCL 1 MG/ML IJ SOLN
INTRAOCULAR | Status: DC | PRN
  Administered 2012-05-06: 14:00:00

## 2012-05-06 MED ORDER — LACTATED RINGERS IV SOLN
INTRAVENOUS | Status: DC | PRN
  Administered 2012-05-06: 13:00:00 via INTRAVENOUS

## 2012-05-06 SURGICAL SUPPLY — 32 items
CAPSULAR TENSION RING-AMO (OPHTHALMIC RELATED) IMPLANT
CLOTH BEACON ORANGE TIMEOUT ST (SAFETY) ×1 IMPLANT
EYE SHIELD UNIVERSAL CLEAR (GAUZE/BANDAGES/DRESSINGS) ×1 IMPLANT
GLOVE BIO SURGEON STRL SZ 6.5 (GLOVE) IMPLANT
GLOVE BIOGEL PI IND STRL 6.5 (GLOVE) IMPLANT
GLOVE BIOGEL PI IND STRL 7.0 (GLOVE) IMPLANT
GLOVE BIOGEL PI IND STRL 7.5 (GLOVE) IMPLANT
GLOVE BIOGEL PI INDICATOR 6.5 (GLOVE)
GLOVE BIOGEL PI INDICATOR 7.0 (GLOVE)
GLOVE BIOGEL PI INDICATOR 7.5 (GLOVE)
GLOVE ECLIPSE 6.5 STRL STRAW (GLOVE) ×1 IMPLANT
GLOVE ECLIPSE 7.0 STRL STRAW (GLOVE) IMPLANT
GLOVE ECLIPSE 7.5 STRL STRAW (GLOVE) IMPLANT
GLOVE EXAM NITRILE LRG STRL (GLOVE) IMPLANT
GLOVE EXAM NITRILE MD LF STRL (GLOVE) ×1 IMPLANT
GLOVE SKINSENSE NS SZ6.5 (GLOVE)
GLOVE SKINSENSE NS SZ7.0 (GLOVE)
GLOVE SKINSENSE STRL SZ6.5 (GLOVE) IMPLANT
GLOVE SKINSENSE STRL SZ7.0 (GLOVE) IMPLANT
KIT VITRECTOMY (OPHTHALMIC RELATED) IMPLANT
PAD ARMBOARD 7.5X6 YLW CONV (MISCELLANEOUS) ×1 IMPLANT
PROC W NO LENS (INTRAOCULAR LENS)
PROC W SPEC LENS (INTRAOCULAR LENS)
PROCESS W NO LENS (INTRAOCULAR LENS) IMPLANT
PROCESS W SPEC LENS (INTRAOCULAR LENS) IMPLANT
RING MALYGIN (MISCELLANEOUS) IMPLANT
SIGHTPATH CAT PROC W REG LENS (Ophthalmic Related) ×2 IMPLANT
SYR TB 1ML LL NO SAFETY (SYRINGE) ×1 IMPLANT
TAPE SURG TRANSPORE 1 IN (GAUZE/BANDAGES/DRESSINGS) IMPLANT
TAPE SURGICAL TRANSPORE 1 IN (GAUZE/BANDAGES/DRESSINGS) ×1
VISCOELASTIC ADDITIONAL (OPHTHALMIC RELATED) IMPLANT
WATER STERILE IRR 250ML POUR (IV SOLUTION) ×1 IMPLANT

## 2012-05-06 NOTE — Anesthesia Preprocedure Evaluation (Signed)
Anesthesia Evaluation  Patient identified by MRN, date of birth, ID band Patient awake    Reviewed: Allergy & Precautions, H&P , NPO status , Patient's Chart, lab work & pertinent test results  Airway Mallampati: III      Dental  (+) Missing   Pulmonary COPDCurrent Smoker,  breath sounds clear to auscultation        Cardiovascular hypertension, Pt. on medications + Peripheral Vascular Disease (AAA) Rhythm:Regular Rate:Normal     Neuro/Psych Seizures -, Well Controlled,     GI/Hepatic (+)     substance abuse  IV drug use, Hepatitis -, A  Endo/Other    Renal/GU      Musculoskeletal   Abdominal   Peds  Hematology   Anesthesia Other Findings   Reproductive/Obstetrics                           Anesthesia Physical Anesthesia Plan  ASA: III  Anesthesia Plan: MAC   Post-op Pain Management:    Induction: Intravenous  Airway Management Planned: Nasal Cannula  Additional Equipment:   Intra-op Plan:   Post-operative Plan:   Informed Consent: I have reviewed the patients History and Physical, chart, labs and discussed the procedure including the risks, benefits and alternatives for the proposed anesthesia with the patient or authorized representative who has indicated his/her understanding and acceptance.     Plan Discussed with:   Anesthesia Plan Comments:         Anesthesia Quick Evaluation  

## 2012-05-06 NOTE — Transfer of Care (Signed)
Immediate Anesthesia Transfer of Care Note  Patient: [name].  Procedure(s) Performed: Procedure(s) with comments: CATARACT EXTRACTION PHACO AND INTRAOCULAR LENS PLACEMENT (IOC) (Right) - CDE:13.61  Patient Location: PACU  Anesthesia Type:MAC  Level of Consciousness: awake, alert , oriented and patient cooperative  Airway & Oxygen Therapy: Patient Spontanous Breathing  Post-op Assessment: Report given to PACU RN, Post -op Vital signs reviewed and stable and Patient moving all extremities  Post vital signs: Reviewed and stable  Complications: No apparent anesthesia complications

## 2012-05-06 NOTE — Anesthesia Postprocedure Evaluation (Signed)
  Anesthesia Post-op Note  Patient: [name].  Procedure(s) Performed: Procedure(s) with comments: CATARACT EXTRACTION PHACO AND INTRAOCULAR LENS PLACEMENT (IOC) (Right) - CDE:13.61  Patient Location: PACU  Anesthesia Type:MAC  Level of Consciousness: awake, alert , oriented and patient cooperative  Airway and Oxygen Therapy: Patient Spontanous Breathing  Post-op Pain: none  Post-op Assessment: Post-op Vital signs reviewed, Patient's Cardiovascular Status Stable, Respiratory Function Stable, Patent Airway, No signs of Nausea or vomiting, Adequate PO intake, Pain level controlled, No headache, No backache, No residual numbness and No residual motor weakness  Post-op Vital Signs: Reviewed and stable  Complications: No apparent anesthesia complications

## 2012-05-06 NOTE — Preoperative (Signed)
Beta Blockers   Reason not to administer Beta Blockers:Not Applicable 

## 2012-05-06 NOTE — H&P (Signed)
I have reviewed the H&P, the patient was re-examined, and I have identified no interval changes in medical condition and plan of care since the history and physical of record  

## 2012-05-06 NOTE — Op Note (Signed)
Date of Admission: 05/06/12  Date of Surgery: 05/06/12  Pre-Op Dx: Cataract  Right  Eye  Post-Op Dx: Combined Cataract  Right  Eye, Dx Code 366.19  Surgeon: Gemma Payor, M.D.  Assistants: None  Anesthesia: Topical with MAC  Indications: Painless, progressive loss of vision with compromise of daily activities.  Surgery: Cataract Extraction with Intraocular lens Implant right Eye  Discription: The patient had dilating drops and viscous lidocaine placed into the left eye in the pre-op holding area. After transfer to the operating room, a time out was performed. The patient was then prepped and draped. Beginning with a 75 degree blade a paracentesis port was made at the surgeon's 2 o'clock position. The anterior chamber was then filled with 2% non-preserved lidocaine. This was followed by filling the anterior chamber with Provisc. A bent cystatome needle was used to create a continuous tear capsulotomy. Hydrodissection was performed with balanced salt solution on a Fine canula. The lens nucleus was then removed using the phacoemulsification handpiece. Residual cortex was removed with the I&A handpiece. The anterior chamber and capsular bag were refilled with Provisc. A posterior chamber intraocular lens was placed into the capsular bag with it's injector. The implant was positioned with the Kuglan hook. The Provisc was then removed from the anterior chamber and capsular bag with the I&A handpiece. Stromal hydration of the main incision and paracentesis port was performed with BSS on a Fine canula. The wounds were tested for leak which was negative. The patient tolerated the procedure well. There were no operative complications. The patient was then transferred to the recovery room in stable condition.  Prosthetic device: B&L enVista, MX60, power 18.5D.  Specimen: None  EBL: None  Complications: None

## 2012-05-07 ENCOUNTER — Encounter (HOSPITAL_COMMUNITY): Payer: Self-pay | Admitting: Ophthalmology

## 2012-05-15 ENCOUNTER — Encounter (HOSPITAL_COMMUNITY): Admission: RE | Admit: 2012-05-15 | Payer: Medicare Other | Source: Ambulatory Visit | Admitting: Ophthalmology

## 2012-05-16 ENCOUNTER — Encounter (HOSPITAL_COMMUNITY): Payer: Self-pay | Admitting: Pharmacy Technician

## 2012-05-17 MED ORDER — LIDOCAINE HCL (PF) 1 % IJ SOLN
INTRAMUSCULAR | Status: AC
  Filled 2012-05-17: qty 2

## 2012-05-17 MED ORDER — CYCLOPENTOLATE-PHENYLEPHRINE 0.2-1 % OP SOLN
OPHTHALMIC | Status: AC
  Filled 2012-05-17: qty 2

## 2012-05-17 MED ORDER — PHENYLEPHRINE HCL 2.5 % OP SOLN
OPHTHALMIC | Status: AC
  Filled 2012-05-17: qty 2

## 2012-05-17 MED ORDER — LIDOCAINE HCL 3.5 % OP GEL
OPHTHALMIC | Status: AC
  Filled 2012-05-17: qty 5

## 2012-05-17 MED ORDER — TETRACAINE HCL 0.5 % OP SOLN
OPHTHALMIC | Status: AC
  Filled 2012-05-17: qty 2

## 2012-05-17 MED ORDER — NEOMYCIN-POLYMYXIN-DEXAMETH 3.5-10000-0.1 OP OINT
TOPICAL_OINTMENT | OPHTHALMIC | Status: AC
  Filled 2012-05-17: qty 3.5

## 2012-05-20 ENCOUNTER — Ambulatory Visit (HOSPITAL_COMMUNITY)
Admission: RE | Admit: 2012-05-20 | Discharge: 2012-05-20 | Disposition: A | Discharge status: Home Health | Payer: Medicare Other | Source: Ambulatory Visit | Attending: Ophthalmology | Admitting: Ophthalmology

## 2012-05-20 ENCOUNTER — Encounter (HOSPITAL_COMMUNITY): Payer: Self-pay | Admitting: *Deleted

## 2012-05-20 ENCOUNTER — Encounter (HOSPITAL_COMMUNITY): Payer: Self-pay | Admitting: Anesthesiology

## 2012-05-20 ENCOUNTER — Encounter (HOSPITAL_COMMUNITY)
Admission: RE | Disposition: A | Discharge status: Home Health | Payer: Self-pay | Source: Ambulatory Visit | Attending: Ophthalmology

## 2012-05-20 ENCOUNTER — Ambulatory Visit (HOSPITAL_COMMUNITY): Payer: Medicare Other | Admitting: Anesthesiology

## 2012-05-20 DIAGNOSIS — H2589 Other age-related cataract: Secondary | ICD-10-CM | POA: Insufficient documentation

## 2012-05-20 DIAGNOSIS — I10 Essential (primary) hypertension: Secondary | ICD-10-CM | POA: Insufficient documentation

## 2012-05-20 DIAGNOSIS — J449 Chronic obstructive pulmonary disease, unspecified: Secondary | ICD-10-CM | POA: Insufficient documentation

## 2012-05-20 DIAGNOSIS — J4489 Other specified chronic obstructive pulmonary disease: Secondary | ICD-10-CM | POA: Insufficient documentation

## 2012-05-20 HISTORY — PX: CATARACT EXTRACTION W/PHACO: SHX586

## 2012-05-20 SURGERY — PHACOEMULSIFICATION, CATARACT, WITH IOL INSERTION
Anesthesia: Monitor Anesthesia Care | Site: Eye | Laterality: Left | Wound class: Clean

## 2012-05-20 MED ORDER — PHENYLEPHRINE HCL 2.5 % OP SOLN
1.0000 [drp] | OPHTHALMIC | Status: AC
  Administered 2012-05-20 (×3): 1 [drp] via OPHTHALMIC

## 2012-05-20 MED ORDER — BSS IO SOLN
INTRAOCULAR | Status: DC | PRN
  Administered 2012-05-20: 15 mL via INTRAOCULAR

## 2012-05-20 MED ORDER — LIDOCAINE HCL (PF) 1 % IJ SOLN
INTRAOCULAR | Status: DC | PRN
  Administered 2012-05-20: 12:00:00 via OPHTHALMIC

## 2012-05-20 MED ORDER — LIDOCAINE HCL 3.5 % OP GEL
1.0000 "application " | Freq: Once | OPHTHALMIC | Status: AC
  Administered 2012-05-20: 1 via OPHTHALMIC

## 2012-05-20 MED ORDER — POVIDONE-IODINE 5 % OP SOLN
OPHTHALMIC | Status: DC | PRN
  Administered 2012-05-20: 1 via OPHTHALMIC

## 2012-05-20 MED ORDER — EPINEPHRINE HCL 1 MG/ML IJ SOLN
INTRAOCULAR | Status: DC | PRN
  Administered 2012-05-20: 12:00:00

## 2012-05-20 MED ORDER — PROVISC 10 MG/ML IO SOLN
INTRAOCULAR | Status: DC | PRN
  Administered 2012-05-20: 8.5 mg via INTRAOCULAR

## 2012-05-20 MED ORDER — EPINEPHRINE HCL 1 MG/ML IJ SOLN
INTRAMUSCULAR | Status: AC
  Filled 2012-05-20: qty 1

## 2012-05-20 MED ORDER — LACTATED RINGERS IV SOLN
INTRAVENOUS | Status: DC | PRN
  Administered 2012-05-20: 11:00:00 via INTRAVENOUS

## 2012-05-20 MED ORDER — LACTATED RINGERS IV SOLN
INTRAVENOUS | Status: DC
  Administered 2012-05-20: 1000 mL via INTRAVENOUS

## 2012-05-20 MED ORDER — GLYCOPYRROLATE 0.2 MG/ML IJ SOLN
INTRAMUSCULAR | Status: DC | PRN
  Administered 2012-05-20: 0.2 mg via INTRAVENOUS

## 2012-05-20 MED ORDER — NEOMYCIN-POLYMYXIN-DEXAMETH 0.1 % OP OINT
TOPICAL_OINTMENT | OPHTHALMIC | Status: DC | PRN
  Administered 2012-05-20: 1 via OPHTHALMIC

## 2012-05-20 MED ORDER — TETRACAINE HCL 0.5 % OP SOLN
1.0000 [drp] | OPHTHALMIC | Status: AC
  Administered 2012-05-20 (×3): 1 [drp] via OPHTHALMIC

## 2012-05-20 MED ORDER — GLYCOPYRROLATE 0.2 MG/ML IJ SOLN
INTRAMUSCULAR | Status: AC
  Filled 2012-05-20: qty 1

## 2012-05-20 MED ORDER — CYCLOPENTOLATE-PHENYLEPHRINE 0.2-1 % OP SOLN
1.0000 [drp] | OPHTHALMIC | Status: AC
  Administered 2012-05-20 (×3): 1 [drp] via OPHTHALMIC

## 2012-05-20 MED ORDER — MIDAZOLAM HCL 2 MG/2ML IJ SOLN
1.0000 mg | INTRAMUSCULAR | Status: DC | PRN
  Administered 2012-05-20: 2 mg via INTRAVENOUS

## 2012-05-20 MED ORDER — MIDAZOLAM HCL 2 MG/2ML IJ SOLN
INTRAMUSCULAR | Status: AC
  Filled 2012-05-20: qty 2

## 2012-05-20 SURGICAL SUPPLY — 32 items

## 2012-05-20 NOTE — Anesthesia Procedure Notes (Signed)
Procedure Name: MAC Performed by: ANDRAZA, AMY L Pre-anesthesia Checklist: Patient identified, Timeout performed, Emergency Drugs available, Suction available and Patient being monitored Oxygen Delivery Method: Nasal cannula     

## 2012-05-20 NOTE — Op Note (Signed)
Date of Admission: 05/20/12  Date of Surgery: 05/20/12  Pre-Op Dx: Cataract   Left Eye  Post-Op Dx: Combined Cataract Left Eye, Dx Code 366.19  Surgeon: Gemma Payor, M.D.  Assistants: None  Anesthesia: Topical with MAC  Indications: Painless, progressive loss of vision with compromise of daily activities.  Surgery: Cataract Extraction with Intraocular lens Implant Left Eye  Discription: The patient had dilating drops and viscous lidocaine placed into the left eye in the pre-op holding area. After transfer to the operating room, a time out was performed. The patient was then prepped and draped. Beginning with a 75 degree blade a paracentesis port was made at the surgeon's 2 o'clock position. The anterior chamber was then filled with 2% non-preserved lidocaine. This was followed by filling the anterior chamber with Provisc. A bent cystatome needle was used to create a continuous tear capsulotomy. Hydrodissection was performed with balanced salt solution on a Fine canula. The lens nucleus was then removed using the phacoemulsification handpiece. Residual cortex was removed with the I&A handpiece. The anterior chamber and capsular bag were refilled with Provisc. A posterior chamber intraocular lens was placed into the capsular bag with it's injector. The implant was positioned with the Kuglan hook. The Provisc was then removed from the anterior chamber and capsular bag with the I&A handpiece. Stromal hydration of the main incision and paracentesis port was performed with BSS on a Fine canula. The wounds were tested for leak which was negative. The patient tolerated the procedure well. There were no operative complications. The patient was then transferred to the recovery room in stable condition.  Prosthetic device:  B&L enVista, MX60, power 18.5.  Specimen: None  EBL: None  Complications: None

## 2012-05-20 NOTE — Anesthesia Preprocedure Evaluation (Signed)
Anesthesia Evaluation  Patient identified by MRN, date of birth, ID band Patient awake    Reviewed: Allergy & Precautions, H&P , NPO status , Patient's Chart, lab work & pertinent test results  Airway Mallampati: III      Dental  (+) Missing   Pulmonary COPDCurrent Smoker,  breath sounds clear to auscultation        Cardiovascular hypertension, Pt. on medications + Peripheral Vascular Disease (AAA) Rhythm:Regular Rate:Normal     Neuro/Psych Seizures -, Well Controlled,     GI/Hepatic (+)     substance abuse  IV drug use, Hepatitis -, A  Endo/Other    Renal/GU      Musculoskeletal   Abdominal   Peds  Hematology   Anesthesia Other Findings   Reproductive/Obstetrics                           Anesthesia Physical Anesthesia Plan  ASA: III  Anesthesia Plan: MAC   Post-op Pain Management:    Induction: Intravenous  Airway Management Planned: Nasal Cannula  Additional Equipment:   Intra-op Plan:   Post-operative Plan:   Informed Consent: I have reviewed the patients History and Physical, chart, labs and discussed the procedure including the risks, benefits and alternatives for the proposed anesthesia with the patient or authorized representative who has indicated his/her understanding and acceptance.     Plan Discussed with:   Anesthesia Plan Comments:         Anesthesia Quick Evaluation

## 2012-05-20 NOTE — H&P (Signed)
I have reviewed the H&P, the patient was re-examined, and I have identified no interval changes in medical condition and plan of care since the history and physical of record  

## 2012-05-20 NOTE — Anesthesia Postprocedure Evaluation (Signed)
  Anesthesia Post-op Note  Patient: [name].  Procedure(s) Performed: Procedure(s) with comments: CATARACT EXTRACTION PHACO AND INTRAOCULAR LENS PLACEMENT (IOC) (Left) - CDE: 12.32  Patient Location: Short Stay  Anesthesia Type:MAC  Level of Consciousness: awake, alert , oriented and patient cooperative  Airway and Oxygen Therapy: Patient Spontanous Breathing  Post-op Pain: none  Post-op Assessment: Post-op Vital signs reviewed, Patient's Cardiovascular Status Stable, Respiratory Function Stable and Patent Airway  Post-op Vital Signs: Reviewed and stable  Complications: No apparent anesthesia complications

## 2012-05-20 NOTE — Progress Notes (Signed)
Contacted Dr. Marcos Eke about elevated blood pressure. Patient agreed to go home and take blood pressure medication.(Lisinopril) Patient encouraged to take blood pressure medications as prescribed and monitor BP readings twice daily. Patient verbalized understanding.

## 2012-05-20 NOTE — Transfer of Care (Signed)
Immediate Anesthesia Transfer of Care Note  Patient: [name].  Procedure(s) Performed: Procedure(s) with comments: CATARACT EXTRACTION PHACO AND INTRAOCULAR LENS PLACEMENT (IOC) (Left) - CDE: 12.32  Patient Location: Short Stay  Anesthesia Type:MAC  Level of Consciousness: awake, alert , oriented and patient cooperative  Airway & Oxygen Therapy: Patient Spontanous Breathing  Post-op Assessment: Report given to PACU RN and Post -op Vital signs reviewed and stable  Post vital signs: Reviewed and stable  Complications: No apparent anesthesia complications

## 2012-05-20 NOTE — Preoperative (Signed)
Beta Blockers   Reason not to administer Beta Blockers:Not Applicable 

## 2012-05-23 ENCOUNTER — Encounter (HOSPITAL_COMMUNITY): Payer: Self-pay | Admitting: Ophthalmology

## 2012-10-04 ENCOUNTER — Encounter: Payer: Self-pay | Admitting: Vascular Surgery

## 2012-10-07 ENCOUNTER — Ambulatory Visit: Payer: Medicare Other | Admitting: Surgery

## 2012-10-07 ENCOUNTER — Other Ambulatory Visit: Payer: Medicare Other

## 2012-10-29 ENCOUNTER — Other Ambulatory Visit: Payer: Medicare Other

## 2012-11-01 ENCOUNTER — Other Ambulatory Visit: Payer: Medicare Other

## 2012-11-04 ENCOUNTER — Ambulatory Visit: Payer: Medicare Other | Admitting: Surgery

## 2012-11-04 ENCOUNTER — Inpatient Hospital Stay (HOSPITAL_COMMUNITY): Admission: RE | Admit: 2012-11-04 | Payer: Medicare Other | Source: Ambulatory Visit

## 2012-11-22 ENCOUNTER — Other Ambulatory Visit: Payer: Medicare Other

## 2012-11-25 ENCOUNTER — Other Ambulatory Visit (HOSPITAL_COMMUNITY): Payer: Medicare Other

## 2012-11-25 ENCOUNTER — Ambulatory Visit: Payer: Medicare Other | Admitting: Surgery

## 2012-11-29 ENCOUNTER — Inpatient Hospital Stay: Admission: RE | Admit: 2012-11-29 | Payer: Medicare Other | Source: Ambulatory Visit

## 2012-11-29 ENCOUNTER — Encounter: Payer: Self-pay | Admitting: Surgery

## 2012-12-02 ENCOUNTER — Ambulatory Visit (INDEPENDENT_AMBULATORY_CARE_PROVIDER_SITE_OTHER): Payer: Medicare Other | Admitting: Surgery

## 2012-12-02 ENCOUNTER — Inpatient Hospital Stay (HOSPITAL_COMMUNITY): Admission: RE | Admit: 2012-12-02 | Payer: Medicare Other | Source: Ambulatory Visit

## 2012-12-02 DIAGNOSIS — I714 Abdominal aortic aneurysm, without rupture: Secondary | ICD-10-CM

## 2012-12-02 NOTE — Progress Notes (Signed)
The patient did not show up for his visit today.

## 2012-12-18 ENCOUNTER — Encounter: Payer: Self-pay | Admitting: Surgery

## 2012-12-23 ENCOUNTER — Ambulatory Visit (HOSPITAL_COMMUNITY)
Admission: RE | Admit: 2012-12-23 | Discharge: 2012-12-23 | Disposition: A | Discharge status: Home / Self Care | Payer: Medicare Other | Source: Ambulatory Visit | Attending: Surgery | Admitting: Surgery

## 2012-12-23 ENCOUNTER — Encounter: Payer: Self-pay | Admitting: Surgery

## 2012-12-23 ENCOUNTER — Ambulatory Visit (INDEPENDENT_AMBULATORY_CARE_PROVIDER_SITE_OTHER): Payer: Medicare Other | Admitting: Surgery

## 2012-12-23 ENCOUNTER — Ambulatory Visit
Admission: RE | Admit: 2012-12-23 | Discharge: 2012-12-23 | Disposition: A | Discharge status: Home / Self Care | Payer: Medicare Other | Source: Ambulatory Visit | Attending: Surgery | Admitting: Surgery

## 2012-12-23 VITALS — BP 169/105 | HR 55 | Resp 16 | Ht 71.0 in | Wt 166.0 lb

## 2012-12-23 DIAGNOSIS — I714 Abdominal aortic aneurysm, without rupture, unspecified: Secondary | ICD-10-CM

## 2012-12-23 DIAGNOSIS — R911 Solitary pulmonary nodule: Secondary | ICD-10-CM

## 2012-12-23 DIAGNOSIS — R252 Cramp and spasm: Secondary | ICD-10-CM

## 2012-12-23 MED ORDER — IOHEXOL 300 MG/ML  SOLN
75.0000 mL | Freq: Once | INTRAMUSCULAR | Status: AC | PRN
  Administered 2012-12-23: 75 mL via INTRAVENOUS

## 2012-12-23 NOTE — Progress Notes (Signed)
Patient name: [name]. MRN: [id number] DOB: 04/28/1945 Sex: male     Chief Complaint  Patient presents with  . AAA    6 mo F/up with vascular Lab study.  C/O Hand and Feet cramp off/on 1 yr duration.    HISTORY OF PRESENT ILLNESS: The patient is back today for followup of his abdominal aortic aneurysm.  This was initially detected by MRI.  He denies abdominal pain.  I am also following a lung nodule.  He has had no interval complaints.  Past Medical History  Diagnosis Date  . Hypertension   . Hyperlipidemia   . COPD (chronic obstructive pulmonary disease)   . Peripheral vascular disease   . AAA (abdominal aortic aneurysm)   . 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 Arthritis   . Hepatitis     Hx of Hep A  . Neuropathy     Past Surgical History  Procedure Laterality Date  . Rotator cuff repair  1992    Right shoulder  . Cataract extraction w/phaco Right 05/06/2012    Procedure: CATARACT EXTRACTION PHACO AND INTRAOCULAR LENS PLACEMENT (IOC);  Surgeon: Gemma Payor, MD;  Location: [facility];  Service: Ophthalmology;  Laterality: Right;  CDE:13.61  . Cataract extraction w/phaco Left 05/20/2012    Procedure: CATARACT EXTRACTION PHACO AND INTRAOCULAR LENS PLACEMENT (IOC);  Surgeon: Gemma Payor, MD;  Location: [facility];  Service: Ophthalmology;  Laterality: Left;  CDE: 12.32  . Eye surgery      History   Social History  . Marital Status: Married    Spouse Name: N/A    Number of Children: N/A  . Years of Education: N/A   Occupational History  . Not on file.   Social History Main Topics  . Smoking status: Current Some Day Smoker -- 0.25 packs/day for 40 years    Types: Cigarettes  . Smokeless tobacco: Never Used     Comment: pt states that he only smokes about 2-3 cigs per day  . Alcohol Use: No  . 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 none in 7 years  . Sexual Activity: Yes    Birth Control/  Protection: None   Other Topics Concern  . Not on file   Social History Narrative  . No narrative on file    Family History  Problem Relation Age of Onset  . Hypertension Mother   . Hyperlipidemia Mother   . Heart disease Father   . Heart attack Father   . Hyperlipidemia Father   . Hypertension Father   . Diabetes Sister   . Cancer Sister   . Hyperlipidemia Sister   . Hypertension Sister   . Cancer Brother     Allergies as of 12/23/2012 - Review Complete 12/23/2012  Allergen Reaction Noted  . Sulfa antibiotics Hives 02/22/2011    Current Outpatient Prescriptions on File Prior to Visit  Medication Sig Dispense Refill  . ADVAIR DISKUS 250-50 MCG/DOSE AEPB Inhale 1 puff into the lungs 2 (two) times daily as needed.       . COMBIVENT RESPIMAT 20-100 MCG/ACT AERS respimat Inhale 1 puff into the lungs as needed for wheezing or shortness of breath.       . doxazosin (CARDURA) 4 MG tablet Take 4 mg by mouth at bedtime.      . gabapentin (NEURONTIN) 300 MG capsule Take 300  mg by mouth 3 (three) times daily.      Marland Kitchen lisinopril (PRINIVIL,ZESTRIL) 10 MG tablet Take 10 mg by mouth daily.      Marland Kitchen loratadine (CLARITIN) 10 MG tablet Take 10 mg by mouth daily as needed for allergies.       Marland Kitchen omeprazole (PRILOSEC) 40 MG capsule Take 40 mg by mouth daily.       . simvastatin (ZOCOR) 20 MG tablet Take 20 mg by mouth every evening.      . predniSONE (DELTASONE) 20 MG tablet Take 20 mg by mouth as needed (for asthma flare ups).        No current facility-administered medications on file prior to visit.     REVIEW OF SYSTEMS: No changes from prior visit  PHYSICAL EXAMINATION:   Vital signs are BP 169/105  Pulse 55  Resp 16  Ht 5\' 11"  (1.803 m)  Wt 166 lb (75.297 kg)  BMI 23.16 kg/m2  SpO2 99% General: The patient appears their stated age. HEENT:  No gross abnormalities Pulmonary:  Non labored breathing Abdomen: Soft and non-tender.  Aorta is nontender Musculoskeletal: There are no  major deformities. Neurologic: No focal weakness or paresthesias are detected, Skin: There are no ulcer or rashes noted. Psychiatric: The patient has normal affect. Cardiovascular: There is a regular rate and rhythm without significant murmur appreciated. No carotid bruits  Diagnostic Studies Abdominal ultrasound was ordered and reviewed.  This shows a maximum diameter of 4.5 cm.  CT scan of the chest indicates that the lesions of concern are benign because they have been stable for 2 years.  No further CT scan imaging is recommended Assessment: Abdominal aortic aneurysm Lung nodule Plan: Abdominal aneurysm: I discussed the ultrasound findings today with the patient and family.  I would not recommend endovascular aneurysm repair until his aneurysm reaches greater than 5 cm or he develops symptoms.  I will have him followup in 6 months with a repeat ultrasound.  Lung nodule: Per the CT scan results today this nodule is benign and does not require additional followup.  Jorge Ny, M.D. [facility] [place] Office: [phone] Pager:  [phone]

## 2013-02-12 ENCOUNTER — Other Ambulatory Visit (HOSPITAL_COMMUNITY): Payer: Self-pay | Admitting: Pulmonary Disease

## 2013-02-12 ENCOUNTER — Ambulatory Visit (HOSPITAL_COMMUNITY)
Admission: RE | Admit: 2013-02-12 | Discharge: 2013-02-12 | Disposition: A | Discharge status: Home / Self Care | Payer: Medicare HMO | Source: Ambulatory Visit | Attending: Pulmonary Disease | Admitting: Pulmonary Disease

## 2013-02-12 DIAGNOSIS — J449 Chronic obstructive pulmonary disease, unspecified: Secondary | ICD-10-CM | POA: Insufficient documentation

## 2013-02-12 DIAGNOSIS — R05 Cough: Secondary | ICD-10-CM

## 2013-02-12 DIAGNOSIS — R059 Cough, unspecified: Secondary | ICD-10-CM

## 2013-02-12 DIAGNOSIS — J4489 Other specified chronic obstructive pulmonary disease: Secondary | ICD-10-CM | POA: Insufficient documentation

## 2013-02-12 DIAGNOSIS — I10 Essential (primary) hypertension: Secondary | ICD-10-CM | POA: Insufficient documentation

## 2013-03-19 ENCOUNTER — Other Ambulatory Visit: Payer: Self-pay | Admitting: Surgery

## 2013-03-19 DIAGNOSIS — I714 Abdominal aortic aneurysm, without rupture, unspecified: Secondary | ICD-10-CM

## 2013-06-23 ENCOUNTER — Other Ambulatory Visit (HOSPITAL_COMMUNITY): Payer: Medicare Other

## 2013-06-23 ENCOUNTER — Ambulatory Visit: Payer: Medicare Other | Admitting: Surgery

## 2013-09-01 ENCOUNTER — Ambulatory Visit: Payer: Medicare Other | Admitting: Surgery

## 2013-09-01 ENCOUNTER — Other Ambulatory Visit (HOSPITAL_COMMUNITY): Payer: Medicare Other

## 2013-09-12 ENCOUNTER — Encounter: Payer: Self-pay | Admitting: Surgery

## 2013-09-15 ENCOUNTER — Encounter: Payer: Self-pay | Admitting: Surgery

## 2013-09-15 ENCOUNTER — Ambulatory Visit (HOSPITAL_COMMUNITY)
Admission: RE | Admit: 2013-09-15 | Discharge: 2013-09-15 | Disposition: A | Discharge status: Home / Self Care | Payer: Medicare HMO | Source: Ambulatory Visit | Attending: Surgery | Admitting: Surgery

## 2013-09-15 ENCOUNTER — Other Ambulatory Visit (HOSPITAL_COMMUNITY): Payer: Medicare HMO

## 2013-09-15 ENCOUNTER — Ambulatory Visit (INDEPENDENT_AMBULATORY_CARE_PROVIDER_SITE_OTHER): Payer: Medicare HMO | Admitting: Surgery

## 2013-09-15 ENCOUNTER — Ambulatory Visit: Payer: Self-pay | Admitting: Surgery

## 2013-09-15 VITALS — BP 144/91 | HR 52 | Ht 71.0 in | Wt 155.1 lb

## 2013-09-15 DIAGNOSIS — I714 Abdominal aortic aneurysm, without rupture, unspecified: Secondary | ICD-10-CM

## 2013-09-15 DIAGNOSIS — Z01810 Encounter for preprocedural cardiovascular examination: Secondary | ICD-10-CM

## 2013-09-15 NOTE — Progress Notes (Signed)
Patient name: [name]. MRN: [id number] DOB: Jun 21, 1945 Sex: male     Chief Complaint  Patient presents with  . Re-evaluation    6 month f/u     HISTORY OF PRESENT ILLNESS: The patient is back today for followup of his abdominal aortic aneurysm.  He denies back pain or abdominal pain.  He continues to be managed medically for hyperlipidemia with a statin.  His hypertension is managed with an ACE inhibitor.  He suffers from COPD but is not on oxygen.  He continues to smoke occasionally.  Past Medical History  Diagnosis Date  . Hypertension   . Hyperlipidemia   . COPD (chronic obstructive pulmonary disease)   . Peripheral vascular disease   . AAA (abdominal aortic aneurysm)   . 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 Arthritis   . Hepatitis     Hx of Hep A  . Neuropathy     Past Surgical History  Procedure Laterality Date  . Rotator cuff repair  1992    Right shoulder  . Cataract extraction w/phaco Right 05/06/2012    Procedure: CATARACT EXTRACTION PHACO AND INTRAOCULAR LENS PLACEMENT (IOC);  Surgeon: Tonny Branch, MD;  Location: [facility];  Service: Ophthalmology;  Laterality: Right;  CDE:13.61  . Cataract extraction w/phaco Left 05/20/2012    Procedure: CATARACT EXTRACTION PHACO AND INTRAOCULAR LENS PLACEMENT (IOC);  Surgeon: Tonny Branch, MD;  Location: [facility];  Service: Ophthalmology;  Laterality: Left;  CDE: 12.32  . Eye surgery      History   Social History  . Marital Status: Married    Spouse Name: N/A    Number of Children: N/A  . Years of Education: N/A   Occupational History  . Not on file.   Social History Main Topics  . Smoking status: Current Some Day Smoker -- 0.25 packs/day for 40 years    Types: Cigarettes  . Smokeless tobacco: Never Used     Comment: pt states that he only smokes about 2-3 cigs per day  . Alcohol Use: No  . 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 none in 7  years  . Sexual Activity: Yes    Birth Control/ Protection: None   Other Topics Concern  . Not on file   Social History Narrative  . No narrative on file    Family History  Problem Relation Age of Onset  . Hypertension Mother   . Hyperlipidemia Mother   . Cancer Mother   . Heart disease Father   . Heart attack Father   . Hyperlipidemia Father   . Hypertension Father   . Diabetes Father   . Diabetes Sister   . Cancer Sister   . Hyperlipidemia Sister   . Hypertension Sister   . Cancer Brother   . Hyperlipidemia Brother   . Hypertension Brother     Allergies as of 09/15/2013 - Review Complete 09/15/2013  Allergen Reaction Noted  . Sulfa antibiotics Hives 02/22/2011    Current Outpatient Prescriptions on File Prior to Visit  Medication Sig Dispense Refill  . ADVAIR DISKUS 250-50 MCG/DOSE AEPB Inhale 1 puff into the lungs 2 (two) times daily as needed.       . COMBIVENT RESPIMAT 20-100 MCG/ACT AERS respimat Inhale 1 puff into the lungs as needed for wheezing or shortness of breath.       Marland Kitchen  doxazosin (CARDURA) 4 MG tablet Take 4 mg by mouth at bedtime.      . gabapentin (NEURONTIN) 300 MG capsule Take 300 mg by mouth 3 (three) times daily.      Marland Kitchen loratadine (CLARITIN) 10 MG tablet Take 10 mg by mouth daily as needed for allergies.       . predniSONE (DELTASONE) 20 MG tablet Take 20 mg by mouth as needed (for asthma flare ups).       Marland Kitchen PROAIR HFA 108 (90 BASE) MCG/ACT inhaler continuous as needed.      . simvastatin (ZOCOR) 20 MG tablet Take 20 mg by mouth every evening.      Marland Kitchen lisinopril (PRINIVIL,ZESTRIL) 10 MG tablet Take 10 mg by mouth daily.      Marland Kitchen omeprazole (PRILOSEC) 40 MG capsule Take 40 mg by mouth daily.        No current facility-administered medications on file prior to visit.     REVIEW OF SYSTEMS: Cardiovascular: No chest pain, chest pressure, palpitations, orthopnea, or dyspnea on exertion. No claudication or rest pain,  No history of DVT or  phlebitis. Pulmonary: No productive cough, asthma or wheezing. Neurologic: No weakness, paresthesias, aphasia, or amaurosis. No dizziness.  Right arm numbness Hematologic: No bleeding problems or clotting disorders. Musculoskeletal: No joint pain or joint swelling. Gastrointestinal: No blood in stool or hematemesis Genitourinary: No dysuria or hematuria. Psychiatric:: No history of major depression. Integumentary: No rashes or ulcers. Constitutional: No fever or chills.  PHYSICAL EXAMINATION:   Vital signs are BP 144/91  Pulse 52  Ht 5\' 11"  (1.803 m)  Wt 155 lb 1.6 oz (70.353 kg)  BMI 21.64 kg/m2  SpO2 100% General: The patient appears their stated age. HEENT:  No gross abnormalities Pulmonary:  Non labored breathing Abdomen: Soft and non-tender.  Aorta is palpable and nontender Musculoskeletal: There are no major deformities. Neurologic: No focal weakness or paresthesias are detected, Skin: There are no ulcer or rashes noted. Psychiatric: The patient has normal affect. Cardiovascular: There is a regular rate and rhythm without significant murmur appreciated.  No carotid bruits.  Palpable dorsalis pedis and posterior tibial pulse bilaterally.   Diagnostic Studies I have ordered and reviewed his ultrasound today.  This shows an increase in size from 4.5-5.03 today.  Assessment: Abdominal aortic aneurysm Plan: I discussed with the patient and his wife that his aneurysm has grown by ultrasound measurements to greater than 5 cm.  This represents a 5 mm growth over the past 6 months.  If these measurements are accurate by CT scan, he will need to undergo repair.  I'm sending him for a CT scan of his abdomen and pelvis.  He is scheduled to followup with me in one week.  He will get carotid Doppler studies prior to his visit.  Eldridge Abrahams, M.D. [facility] [place] Office: [phone] Pager:  [phone]

## 2013-09-17 ENCOUNTER — Other Ambulatory Visit: Payer: Self-pay | Admitting: Surgery

## 2013-09-17 ENCOUNTER — Ambulatory Visit (HOSPITAL_COMMUNITY)
Admission: RE | Admit: 2013-09-17 | Discharge: 2013-09-17 | Disposition: A | Discharge status: Home / Self Care | Payer: Medicare HMO | Source: Ambulatory Visit | Attending: Surgery | Admitting: Surgery

## 2013-09-17 DIAGNOSIS — I714 Abdominal aortic aneurysm, without rupture, unspecified: Secondary | ICD-10-CM | POA: Diagnosis present

## 2013-09-17 DIAGNOSIS — I6529 Occlusion and stenosis of unspecified carotid artery: Secondary | ICD-10-CM | POA: Insufficient documentation

## 2013-09-17 DIAGNOSIS — Z01810 Encounter for preprocedural cardiovascular examination: Secondary | ICD-10-CM | POA: Insufficient documentation

## 2013-09-17 DIAGNOSIS — I658 Occlusion and stenosis of other precerebral arteries: Secondary | ICD-10-CM | POA: Diagnosis not present

## 2013-09-18 LAB — CREATININE, SERUM: Creat: 0.95 mg/dL (ref 0.50–1.35)

## 2013-09-18 LAB — BUN: BUN: 15 mg/dL (ref 6–23)

## 2013-09-19 ENCOUNTER — Encounter: Payer: Self-pay | Admitting: Surgery

## 2013-09-22 ENCOUNTER — Ambulatory Visit (INDEPENDENT_AMBULATORY_CARE_PROVIDER_SITE_OTHER): Payer: Medicare HMO | Admitting: Surgery

## 2013-09-22 ENCOUNTER — Ambulatory Visit
Admission: RE | Admit: 2013-09-22 | Discharge: 2013-09-22 | Disposition: A | Discharge status: Home / Self Care | Payer: Medicare HMO | Source: Ambulatory Visit | Attending: Surgery | Admitting: Surgery

## 2013-09-22 ENCOUNTER — Encounter: Payer: Self-pay | Admitting: Surgery

## 2013-09-22 VITALS — BP 129/84 | HR 56 | Ht 71.0 in | Wt 155.4 lb

## 2013-09-22 DIAGNOSIS — Z01810 Encounter for preprocedural cardiovascular examination: Secondary | ICD-10-CM

## 2013-09-22 DIAGNOSIS — I714 Abdominal aortic aneurysm, without rupture, unspecified: Secondary | ICD-10-CM

## 2013-09-22 MED ORDER — IOHEXOL 350 MG/ML SOLN
80.0000 mL | Freq: Once | INTRAVENOUS | Status: AC | PRN
  Administered 2013-09-22: 80 mL via INTRAVENOUS

## 2013-09-22 NOTE — Addendum Note (Signed)
Addended by: Mena Goes on: 09/22/2013 04:38 PM   Modules accepted: Orders

## 2013-09-22 NOTE — Progress Notes (Signed)
Patient name: [name]. MRN: [id number] DOB: 06/16/1945 Sex: male     Chief Complaint  Patient presents with  . Re-evaluation    1 wk f/u CTA abd/pel     HISTORY OF PRESENT ILLNESS: The patient returns today for review of the angiogram for his abdominal aortic aneurysm.He denies back pain or abdominal pain. He continues to be managed medically for hyperlipidemia with a statin. His hypertension is managed with an ACE inhibitor. He suffers from COPD but is not on oxygen. He continues to smoke occasionally.   Past Medical History  Diagnosis Date  . Hypertension   . Hyperlipidemia   . COPD (chronic obstructive pulmonary disease)   . Peripheral vascular disease   . AAA (abdominal aortic aneurysm)   . 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 Arthritis   . Hepatitis     Hx of Hep A  . Neuropathy     Past Surgical History  Procedure Laterality Date  . Rotator cuff repair  1992    Right shoulder  . Cataract extraction w/phaco Right 05/06/2012    Procedure: CATARACT EXTRACTION PHACO AND INTRAOCULAR LENS PLACEMENT (IOC);  Surgeon: Tonny Branch, MD;  Location: [facility];  Service: Ophthalmology;  Laterality: Right;  CDE:13.61  . Cataract extraction w/phaco Left 05/20/2012    Procedure: CATARACT EXTRACTION PHACO AND INTRAOCULAR LENS PLACEMENT (IOC);  Surgeon: Tonny Branch, MD;  Location: [facility];  Service: Ophthalmology;  Laterality: Left;  CDE: 12.32  . Eye surgery      History   Social History  . Marital Status: Married    Spouse Name: N/A    Number of Children: N/A  . Years of Education: N/A   Occupational History  . Not on file.   Social History Main Topics  . Smoking status: Current Some Day Smoker -- 0.01 packs/day for 40 years    Types: Cigarettes  . Smokeless tobacco: Never Used     Comment: pt states that he only smokes about 2-3 cigs per day  . Alcohol Use: No  . 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 none in 7 years  . Sexual Activity: Yes    Birth Control/ Protection: None   Other Topics Concern  . Not on file   Social History Narrative  . No narrative on file    Family History  Problem Relation Age of Onset  . Hypertension Mother   . Hyperlipidemia Mother   . Cancer Mother   . Heart disease Father   . Heart attack Father   . Hyperlipidemia Father   . Hypertension Father   . Diabetes Father   . Diabetes Sister   . Cancer Sister   . Hyperlipidemia Sister   . Hypertension Sister   . Cancer Brother   . Hyperlipidemia Brother   . Hypertension Brother     Allergies as of 09/22/2013 - Review Complete 09/22/2013  Allergen Reaction Noted  . Sulfa antibiotics Hives 02/22/2011    Current Outpatient Prescriptions on File Prior to Visit  Medication Sig Dispense Refill  . ADVAIR DISKUS 250-50 MCG/DOSE AEPB Inhale 1 puff into the lungs 2 (two) times daily as needed.       . COMBIVENT RESPIMAT 20-100 MCG/ACT AERS respimat Inhale 1 puff into the lungs as needed for wheezing or shortness of breath.       . doxazosin (  CARDURA) 4 MG tablet Take 4 mg by mouth at bedtime.      . gabapentin (NEURONTIN) 300 MG capsule Take 300 mg by mouth 3 (three) times daily.      Marland Kitchen lisinopril (PRINIVIL,ZESTRIL) 10 MG tablet Take 10 mg by mouth daily.      Marland Kitchen loratadine (CLARITIN) 10 MG tablet Take 10 mg by mouth daily as needed for allergies.       Marland Kitchen omeprazole (PRILOSEC) 40 MG capsule Take 40 mg by mouth daily.       . predniSONE (DELTASONE) 20 MG tablet Take 20 mg by mouth as needed (for asthma flare ups).       Marland Kitchen PROAIR HFA 108 (90 BASE) MCG/ACT inhaler continuous as needed.      . simvastatin (ZOCOR) 20 MG tablet Take 20 mg by mouth every evening.       No current facility-administered medications on file prior to visit.     REVIEW OF SYSTEMS: No changes from the visit last week  PHYSICAL EXAMINATION:   Vital signs are BP 129/84  Pulse 56  Ht 5\' 11"  (1.803 m)  Wt 155 lb 6.4  oz (70.489 kg)  BMI 21.68 kg/m2  SpO2 100% General: The patient appears their stated age. HEENT:  No gross abnormalities Pulmonary:  Non labored breathing Abdomen: Soft and non-tender.  Aorta is easily palpated and nontender  Musculoskeletal: There are no major deformities. Neurologic: No focal weakness or paresthesias are detected, Skin: There are no ulcer or rashes noted. Psychiatric: The patient has normal affect. Cardiovascular: There is a regular rate and rhythm without significant murmur palpable pedal pulses    Diagnostic Studies I have reviewed his CT angiogram which reveals a 5 cm infrarenal aneurysm Carotid Doppler has been reviewed by myself which showed less than 40% stenosis bilaterally. Assessment: Abdominal aortic aneurysm Plan: The patient's aneurysm now measures 5 cm.  I have recommended repair.  I think he can be done using a stent graft.  We discussed the risks and benefits including the risk of cardiopulmonary complications, bleeding, death, intestinal ischemia, lower extremity ischemia, and renal dysfunction.  I am going to get a Myoview prior to his operation which is been scheduled for Thursday, September 10.  Eldridge Abrahams, M.D. [facility] [place] Office: [phone] Pager:  [phone]

## 2013-09-23 ENCOUNTER — Other Ambulatory Visit: Payer: Self-pay

## 2013-09-25 ENCOUNTER — Encounter (HOSPITAL_COMMUNITY): Payer: Self-pay | Admitting: Pharmacist

## 2013-09-25 ENCOUNTER — Encounter: Payer: Self-pay | Admitting: Surgery

## 2013-09-25 NOTE — Pre-Procedure Instructions (Signed)
[name].  09/25/2013   Your procedure is scheduled on:  Thurs, Sept 10 @ 12:00 PM  Report to [facility]  at 10:00 AM.  Call this number if you have problems the morning of surgery: [phone]   Remember:   Do not eat food or drink liquids after midnight.   Take these medicines the morning of surgery with A SIP OF WATER: Advair(Fluticasone)<Bring Your Inhaler With You>,Gabapentin(Neurontin),Duoneb(Ipraotopium),ProAir(if needed),Claritin(Loratadine-if needed),and Zantac(Ranitidine),                No Goody's,BC's,Aleve,Ibuprofen,Fish Oil,or any Herbal Medications   Do not wear jewelry  Do not wear lotions, powders, or colognes. You may wear deodorant.             Men may shave face and neck.  Do not bring valuables to the hospital.  [facility] is not responsible                  for any belongings or valuables.               Contacts, dentures or bridgework may not be worn into surgery.  Leave suitcase in the car. After surgery it may be brought to your room.  For patients admitted to the hospital, discharge time is determined by your                treatment team.               Special Instructions:  [place] - Preparing for Surgery  Before surgery, you can play an important role.  Because skin is not sterile, your skin needs to be as free of germs as possible.  You can reduce the number of germs on you skin by washing with CHG (chlorahexidine gluconate) soap before surgery.  CHG is an antiseptic cleaner which kills germs and bonds with the skin to continue killing germs even after washing.  Please DO NOT use if you have an allergy to CHG or antibacterial soaps.  If your skin becomes reddened/irritated stop using the CHG and inform your nurse when you arrive at Short Stay.  Do not shave (including legs and underarms) for at least 48 hours prior to the first CHG shower.  You may shave your face.  Please follow these instructions carefully:   1.  Shower with CHG  Soap the night before surgery and the                                morning of Surgery.  2.  If you choose to wash your hair, wash your hair first as usual with your       normal shampoo.  3.  After you shampoo, rinse your hair and body thoroughly to remove the                      Shampoo.  4.  Use CHG as you would any other liquid soap.  You can apply chg directly       to the skin and wash gently with scrungie or a clean washcloth.  5.  Apply the CHG Soap to your body ONLY FROM THE NECK DOWN.        Do not use on open wounds or open sores.  Avoid contact with your eyes,       ears, mouth and genitals (private parts).  Wash genitals (private parts)  with your normal soap.  6.  Wash thoroughly, paying special attention to the area where your surgery        will be performed.  7.  Thoroughly rinse your body with warm water from the neck down.  8.  DO NOT shower/wash with your normal soap after using and rinsing off       the CHG Soap.  9.  Pat yourself dry with a clean towel.            10.  Wear clean pajamas.            11.  Place clean sheets on your bed the night of your first shower and do not        sleep with pets.  Day of Surgery  Do not apply any lotions/deoderants the morning of surgery.  Please wear clean clothes to the hospital/surgery center.     Please read over the following fact sheets that you were given: Pain Booklet, Coughing and Deep Breathing, Blood Transfusion Information, MRSA Information and Surgical Site Infection Prevention

## 2013-09-26 ENCOUNTER — Encounter (HOSPITAL_COMMUNITY)
Admission: RE | Admit: 2013-09-26 | Discharge: 2013-09-26 | Disposition: A | Discharge status: Home / Self Care | Payer: Medicare HMO | Source: Ambulatory Visit | Attending: Surgery | Admitting: Surgery

## 2013-09-26 ENCOUNTER — Encounter (HOSPITAL_COMMUNITY): Payer: Self-pay

## 2013-09-26 DIAGNOSIS — I714 Abdominal aortic aneurysm, without rupture, unspecified: Secondary | ICD-10-CM | POA: Insufficient documentation

## 2013-09-26 DIAGNOSIS — Z01818 Encounter for other preprocedural examination: Secondary | ICD-10-CM | POA: Insufficient documentation

## 2013-09-26 HISTORY — DX: Gastro-esophageal reflux disease without esophagitis: K21.9

## 2013-09-26 HISTORY — DX: Anxiety disorder, unspecified: F41.9

## 2013-09-26 HISTORY — DX: Unspecified asthma, uncomplicated: J45.909

## 2013-09-26 HISTORY — DX: Major depressive disorder, single episode, unspecified: F32.9

## 2013-09-26 HISTORY — DX: Pain in unspecified joint: M25.50

## 2013-09-26 HISTORY — DX: Dorsalgia, unspecified: M54.9

## 2013-09-26 HISTORY — DX: Personal history of colon polyps, unspecified: Z86.0100

## 2013-09-26 HISTORY — DX: Depression, unspecified: F32.A

## 2013-09-26 HISTORY — DX: Personal history of colonic polyps: Z86.010

## 2013-09-26 HISTORY — DX: Personal history of peptic ulcer disease: Z87.11

## 2013-09-26 HISTORY — DX: Personal history of other infectious and parasitic diseases: Z86.19

## 2013-09-26 HISTORY — DX: Emphysema, unspecified: J43.9

## 2013-09-26 HISTORY — DX: Shortness of breath: R06.02

## 2013-09-26 HISTORY — DX: Other chronic pain: G89.29

## 2013-09-26 HISTORY — DX: Pneumonia, unspecified organism: J18.9

## 2013-09-26 HISTORY — DX: Personal history of other diseases of the digestive system: Z87.19

## 2013-09-26 HISTORY — DX: Personal history of other diseases of the respiratory system: Z87.09

## 2013-09-26 LAB — BLOOD GAS, ARTERIAL
Acid-Base Excess: 0.2 mmol/L (ref 0.0–2.0)
BICARBONATE: 24.2 meq/L — AB (ref 20.0–24.0)
Drawn by: 206361
FIO2: 0.21 %
O2 SAT: 95.6 %
PO2 ART: 78.8 mmHg — AB (ref 80.0–100.0)
Patient temperature: 98.6
TCO2: 25.4 mmol/L (ref 0–100)
pCO2 arterial: 39 mmHg (ref 35.0–45.0)
pH, Arterial: 7.41 (ref 7.350–7.450)

## 2013-09-26 LAB — APTT: aPTT: 37 seconds (ref 24–37)

## 2013-09-26 LAB — COMPREHENSIVE METABOLIC PANEL
ALT: 19 U/L (ref 0–53)
ANION GAP: 13 (ref 5–15)
AST: 29 U/L (ref 0–37)
Albumin: 3.4 g/dL — ABNORMAL LOW (ref 3.5–5.2)
Alkaline Phosphatase: 103 U/L (ref 39–117)
BUN: 15 mg/dL (ref 6–23)
CALCIUM: 8.8 mg/dL (ref 8.4–10.5)
CO2: 21 meq/L (ref 19–32)
Chloride: 104 mEq/L (ref 96–112)
Creatinine, Ser: 0.93 mg/dL (ref 0.50–1.35)
GFR, EST NON AFRICAN AMERICAN: 84 mL/min — AB (ref >90)
GLUCOSE: 93 mg/dL (ref 70–99)
Potassium: 4 mEq/L (ref 3.7–5.3)
Sodium: 138 mEq/L (ref 137–147)
Total Bilirubin: 0.6 mg/dL (ref 0.3–1.2)
Total Protein: 6.7 g/dL (ref 6.0–8.3)

## 2013-09-26 LAB — URINALYSIS, ROUTINE W REFLEX MICROSCOPIC
Bilirubin Urine: NEGATIVE
GLUCOSE, UA: NEGATIVE mg/dL
HGB URINE DIPSTICK: NEGATIVE
KETONES UR: NEGATIVE mg/dL
LEUKOCYTES UA: NEGATIVE
Nitrite: NEGATIVE
Protein, ur: NEGATIVE mg/dL
Specific Gravity, Urine: 1.011 (ref 1.005–1.030)
Urobilinogen, UA: 0.2 mg/dL (ref 0.0–1.0)
pH: 5.5 (ref 5.0–8.0)

## 2013-09-26 LAB — CBC
HCT: 38.7 % — ABNORMAL LOW (ref 39.0–52.0)
Hemoglobin: 13 g/dL (ref 13.0–17.0)
MCH: 29.3 pg (ref 26.0–34.0)
MCHC: 33.6 g/dL (ref 30.0–36.0)
MCV: 87.2 fL (ref 78.0–100.0)
Platelets: 154 10*3/uL (ref 150–400)
RBC: 4.44 MIL/uL (ref 4.22–5.81)
RDW: 14.7 % (ref 11.5–15.5)
WBC: 6.7 10*3/uL (ref 4.0–10.5)

## 2013-09-26 LAB — ABO/RH: ABO/RH(D): B POS

## 2013-09-26 LAB — TYPE AND SCREEN
ABO/RH(D): B POS
Antibody Screen: NEGATIVE

## 2013-09-26 LAB — SURGICAL PCR SCREEN
MRSA, PCR: NEGATIVE
Staphylococcus aureus: NEGATIVE

## 2013-09-26 LAB — PROTIME-INR
INR: 1.15 (ref 0.00–1.49)
PROTHROMBIN TIME: 14.7 s (ref 11.6–15.2)

## 2013-09-26 MED ORDER — CHLORHEXIDINE GLUCONATE CLOTH 2 % EX PADS: 6.0000 | MEDICATED_PAD | Freq: Once | CUTANEOUS | Status: DC

## 2013-09-26 NOTE — Progress Notes (Signed)
Pt doesn't have a cardiologist  Has never had a stress test but one is scheduled for Sept 8 with Dr.Ganji  Denies ever having an echo or heart cath  Medical MD is Dr.Kirk Bluth  CXR in epic from 02-12-13  Denies EKG in past yr

## 2013-09-30 NOTE — Progress Notes (Addendum)
Anesthesia Chart Review:  Patient is a 68 year old male scheduled for EVAR 5 cm AAA on 10/02/13 by Dr. Trula Slade.  History includes AAA, smoking, HLD, GERD, COPD/emphysema, asthma, exertional dyspnea, seizure '92 (now off medication for 10 years), situational depression, anxiety, hepatitis A, gastric ulcer, chronic back pain, neuropathy. PCP is listed as Dr. Celedonio Savage.  EKG on 09/26/13 showed SB.   Carotid duplex on 09/17/13 showed less than 40% bilateral ICA stenosis.  CXR on 02/12/13 showed: COPD without acute cardiopulmonary abnormalities.  Preoperative labs noted.   Dr. Trula Slade has arranged for patient to have a stress test on 09/30/13 at [facility] (Dr. Einar Gip).  Plans to proceed on 10/02/13 will depend on results.   [facility] Short Stay Center/Anesthesiology Phone [phone] 09/30/2013 12:34 PM  Addendum: 10/01/2013 12:43 PM Nuclear stress test on 09/30/13 showed: Normal myocardial perfusion imaging is normal.  Overall LV systolic function was normal without regional wall motion abnormalities.  LVEF 59%.  Perfusion imaging was normal, so I anticipate that she can proceed as planned.

## 2013-10-01 MED ORDER — DEXTROSE 5 % IV SOLN
1.5000 g | INTRAVENOUS | Status: AC
  Administered 2013-10-02: 1.5 g via INTRAVENOUS
  Filled 2013-10-01: qty 1.5

## 2013-10-01 NOTE — Progress Notes (Signed)
Notified pt of time change via voicemail;requested that he call to confirm new time of 0915

## 2013-10-01 NOTE — Progress Notes (Signed)
Pt called back to confirm that he will be here at [place]

## 2013-10-02 ENCOUNTER — Encounter (HOSPITAL_COMMUNITY): Payer: Medicare HMO | Admitting: Vascular Surgery

## 2013-10-02 ENCOUNTER — Other Ambulatory Visit: Payer: Self-pay | Admitting: *Deleted

## 2013-10-02 ENCOUNTER — Inpatient Hospital Stay (HOSPITAL_COMMUNITY): Payer: Medicare HMO | Admitting: Certified Registered Nurse Anesthetist

## 2013-10-02 ENCOUNTER — Inpatient Hospital Stay (HOSPITAL_COMMUNITY)
Admission: RE | Admit: 2013-10-02 | Discharge: 2013-10-03 | DRG: 238 | Disposition: A | Discharge status: Home / Self Care | Payer: Medicare HMO | Source: Ambulatory Visit | Attending: Surgery | Admitting: Surgery

## 2013-10-02 ENCOUNTER — Inpatient Hospital Stay (HOSPITAL_COMMUNITY): Payer: Medicare HMO

## 2013-10-02 ENCOUNTER — Encounter (HOSPITAL_COMMUNITY)
Admission: RE | Disposition: A | Discharge status: Home / Self Care | Payer: Self-pay | Source: Ambulatory Visit | Attending: Surgery

## 2013-10-02 ENCOUNTER — Encounter (HOSPITAL_COMMUNITY): Payer: Self-pay | Admitting: Certified Registered Nurse Anesthetist

## 2013-10-02 DIAGNOSIS — M129 Arthropathy, unspecified: Secondary | ICD-10-CM | POA: Diagnosis present

## 2013-10-02 DIAGNOSIS — I714 Abdominal aortic aneurysm, without rupture, unspecified: Secondary | ICD-10-CM

## 2013-10-02 DIAGNOSIS — G589 Mononeuropathy, unspecified: Secondary | ICD-10-CM | POA: Diagnosis present

## 2013-10-02 DIAGNOSIS — F17200 Nicotine dependence, unspecified, uncomplicated: Secondary | ICD-10-CM | POA: Diagnosis present

## 2013-10-02 DIAGNOSIS — J4489 Other specified chronic obstructive pulmonary disease: Secondary | ICD-10-CM | POA: Diagnosis present

## 2013-10-02 DIAGNOSIS — Z833 Family history of diabetes mellitus: Secondary | ICD-10-CM

## 2013-10-02 DIAGNOSIS — E785 Hyperlipidemia, unspecified: Secondary | ICD-10-CM | POA: Diagnosis present

## 2013-10-02 DIAGNOSIS — Z8249 Family history of ischemic heart disease and other diseases of the circulatory system: Secondary | ICD-10-CM | POA: Diagnosis not present

## 2013-10-02 DIAGNOSIS — I10 Essential (primary) hypertension: Secondary | ICD-10-CM | POA: Diagnosis present

## 2013-10-02 DIAGNOSIS — I739 Peripheral vascular disease, unspecified: Secondary | ICD-10-CM | POA: Diagnosis present

## 2013-10-02 DIAGNOSIS — Z882 Allergy status to sulfonamides status: Secondary | ICD-10-CM

## 2013-10-02 DIAGNOSIS — J449 Chronic obstructive pulmonary disease, unspecified: Secondary | ICD-10-CM | POA: Diagnosis not present

## 2013-10-02 DIAGNOSIS — Z48812 Encounter for surgical aftercare following surgery on the circulatory system: Secondary | ICD-10-CM

## 2013-10-02 HISTORY — PX: ABDOMINAL AORTIC ENDOVASCULAR STENT GRAFT: SHX5707

## 2013-10-02 LAB — MAGNESIUM: Magnesium: 1.8 mg/dL (ref 1.5–2.5)

## 2013-10-02 LAB — CBC
HEMATOCRIT: 36.4 % — AB (ref 39.0–52.0)
Hemoglobin: 12 g/dL — ABNORMAL LOW (ref 13.0–17.0)
MCH: 29.4 pg (ref 26.0–34.0)
MCHC: 33 g/dL (ref 30.0–36.0)
MCV: 89.2 fL (ref 78.0–100.0)
PLATELETS: 125 10*3/uL — AB (ref 150–400)
RBC: 4.08 MIL/uL — ABNORMAL LOW (ref 4.22–5.81)
RDW: 14.7 % (ref 11.5–15.5)
WBC: 5.4 10*3/uL (ref 4.0–10.5)

## 2013-10-02 LAB — BASIC METABOLIC PANEL
ANION GAP: 10 (ref 5–15)
BUN: 12 mg/dL (ref 6–23)
CALCIUM: 8.2 mg/dL — AB (ref 8.4–10.5)
CHLORIDE: 106 meq/L (ref 96–112)
CO2: 24 mEq/L (ref 19–32)
Creatinine, Ser: 0.76 mg/dL (ref 0.50–1.35)
Glucose, Bld: 87 mg/dL (ref 70–99)
Potassium: 4 mEq/L (ref 3.7–5.3)
Sodium: 140 mEq/L (ref 137–147)

## 2013-10-02 LAB — PROTIME-INR
INR: 1.34 (ref 0.00–1.49)
Prothrombin Time: 16.6 seconds — ABNORMAL HIGH (ref 11.6–15.2)

## 2013-10-02 LAB — APTT: APTT: 42 s — AB (ref 24–37)

## 2013-10-02 SURGERY — INSERTION, ENDOVASCULAR STENT GRAFT, AORTA, ABDOMINAL
Anesthesia: Monitor Anesthesia Care | Site: Abdomen

## 2013-10-02 MED ORDER — OXYCODONE HCL 5 MG PO TABS
ORAL_TABLET | ORAL | Status: AC
  Filled 2013-10-02: qty 1

## 2013-10-02 MED ORDER — SODIUM CHLORIDE 0.9 % IR SOLN
Status: DC | PRN
  Administered 2013-10-02: 13:00:00

## 2013-10-02 MED ORDER — SODIUM CHLORIDE 0.9 % IV SOLN: 500.0000 mL | Freq: Once | INTRAVENOUS | Status: AC | PRN

## 2013-10-02 MED ORDER — MORPHINE SULFATE 2 MG/ML IJ SOLN
2.0000 mg | INTRAMUSCULAR | Status: DC | PRN
  Administered 2013-10-02 – 2013-10-03 (×4): 2 mg via INTRAVENOUS
  Filled 2013-10-02 (×4): qty 1

## 2013-10-02 MED ORDER — LIDOCAINE HCL (CARDIAC) 20 MG/ML IV SOLN
INTRAVENOUS | Status: AC
  Filled 2013-10-02: qty 5

## 2013-10-02 MED ORDER — ALBUTEROL SULFATE HFA 108 (90 BASE) MCG/ACT IN AERS: 2.0000 | INHALATION_SPRAY | Freq: Four times a day (QID) | RESPIRATORY_TRACT | Status: DC | PRN

## 2013-10-02 MED ORDER — HYDRALAZINE HCL 20 MG/ML IJ SOLN
INTRAMUSCULAR | Status: AC
  Filled 2013-10-02: qty 1

## 2013-10-02 MED ORDER — LACTATED RINGERS IV SOLN
INTRAVENOUS | Status: DC | PRN
  Administered 2013-10-02 (×2): via INTRAVENOUS

## 2013-10-02 MED ORDER — MIDAZOLAM HCL 2 MG/2ML IJ SOLN
INTRAMUSCULAR | Status: AC
Start: 2013-10-02 — End: 2013-10-02
  Filled 2013-10-02: qty 2

## 2013-10-02 MED ORDER — PROTAMINE SULFATE 10 MG/ML IV SOLN
INTRAVENOUS | Status: DC | PRN
  Administered 2013-10-02 (×5): 10 mg via INTRAVENOUS

## 2013-10-02 MED ORDER — FENTANYL CITRATE 0.05 MG/ML IJ SOLN
INTRAMUSCULAR | Status: AC
  Filled 2013-10-02: qty 5

## 2013-10-02 MED ORDER — MOMETASONE FURO-FORMOTEROL FUM 200-5 MCG/ACT IN AERO
2.0000 | INHALATION_SPRAY | Freq: Two times a day (BID) | RESPIRATORY_TRACT | Status: DC
  Administered 2013-10-02 – 2013-10-03 (×2): 2 via RESPIRATORY_TRACT
  Filled 2013-10-02: qty 8.8

## 2013-10-02 MED ORDER — HYDRALAZINE HCL 20 MG/ML IJ SOLN
10.0000 mg | INTRAMUSCULAR | Status: DC | PRN
Start: 2013-10-02 — End: 2013-10-03
  Administered 2013-10-02: 10 mg via INTRAVENOUS
  Filled 2013-10-02: qty 0.5

## 2013-10-02 MED ORDER — DEXTROSE 5 % IV SOLN
1.5000 g | Freq: Two times a day (BID) | INTRAVENOUS | Status: DC
  Administered 2013-10-02: 1.5 g via INTRAVENOUS
  Filled 2013-10-02 (×2): qty 1.5

## 2013-10-02 MED ORDER — DOXAZOSIN MESYLATE 4 MG PO TABS
4.0000 mg | ORAL_TABLET | Freq: Every day | ORAL | Status: DC
  Administered 2013-10-02: 4 mg via ORAL
  Filled 2013-10-02 (×2): qty 1

## 2013-10-02 MED ORDER — ONDANSETRON HCL 4 MG/2ML IJ SOLN
4.0000 mg | Freq: Four times a day (QID) | INTRAMUSCULAR | Status: DC | PRN
  Administered 2013-10-02 – 2013-10-03 (×3): 4 mg via INTRAVENOUS
  Filled 2013-10-02 (×3): qty 2

## 2013-10-02 MED ORDER — BUPIVACAINE IN DEXTROSE 0.75-8.25 % IT SOLN
INTRATHECAL | Status: DC | PRN
  Administered 2013-10-02: 2 mL via INTRATHECAL

## 2013-10-02 MED ORDER — GLYCOPYRROLATE 0.2 MG/ML IJ SOLN
INTRAMUSCULAR | Status: DC | PRN
  Administered 2013-10-02 (×2): 0.1 mg via INTRAVENOUS

## 2013-10-02 MED ORDER — PROPOFOL 10 MG/ML IV BOLUS
INTRAVENOUS | Status: AC
  Filled 2013-10-02: qty 20

## 2013-10-02 MED ORDER — ACETAMINOPHEN 325 MG PO TABS: 325.0000 mg | ORAL_TABLET | ORAL | Status: DC | PRN

## 2013-10-02 MED ORDER — LACTATED RINGERS IV SOLN: INTRAVENOUS | Status: DC

## 2013-10-02 MED ORDER — ACETAMINOPHEN 650 MG RE SUPP: 325.0000 mg | RECTAL | Status: DC | PRN

## 2013-10-02 MED ORDER — SIMVASTATIN 20 MG PO TABS
20.0000 mg | ORAL_TABLET | Freq: Every day | ORAL | Status: DC
  Administered 2013-10-02: 20 mg via ORAL
  Filled 2013-10-02 (×2): qty 1

## 2013-10-02 MED ORDER — FENTANYL CITRATE 0.05 MG/ML IJ SOLN
25.0000 ug | INTRAMUSCULAR | Status: DC | PRN
  Administered 2013-10-02 (×2): 50 ug via INTRAVENOUS

## 2013-10-02 MED ORDER — OXYCODONE HCL 5 MG PO TABS: 5.0000 mg | ORAL_TABLET | Freq: Four times a day (QID) | ORAL | Status: DC | PRN

## 2013-10-02 MED ORDER — LABETALOL HCL 5 MG/ML IV SOLN
10.0000 mg | INTRAVENOUS | Status: DC | PRN
Start: 2013-10-02 — End: 2013-10-03
  Filled 2013-10-02: qty 4

## 2013-10-02 MED ORDER — METOPROLOL TARTRATE 1 MG/ML IV SOLN: 2.0000 mg | INTRAVENOUS | Status: DC | PRN

## 2013-10-02 MED ORDER — SODIUM CHLORIDE 0.9 % IV SOLN: INTRAVENOUS | Status: DC

## 2013-10-02 MED ORDER — ALBUTEROL SULFATE (2.5 MG/3ML) 0.083% IN NEBU: 2.5000 mg | INHALATION_SOLUTION | Freq: Four times a day (QID) | RESPIRATORY_TRACT | Status: DC | PRN

## 2013-10-02 MED ORDER — HEPARIN SODIUM (PORCINE) 1000 UNIT/ML IJ SOLN
INTRAMUSCULAR | Status: AC
  Filled 2013-10-02: qty 1

## 2013-10-02 MED ORDER — SODIUM CHLORIDE 0.9 % IV SOLN
INTRAVENOUS | Status: DC
  Administered 2013-10-02: 20:00:00 via INTRAVENOUS

## 2013-10-02 MED ORDER — LORATADINE 10 MG PO TABS
10.0000 mg | ORAL_TABLET | Freq: Every day | ORAL | Status: DC | PRN
  Filled 2013-10-02: qty 1

## 2013-10-02 MED ORDER — FAMOTIDINE 20 MG PO TABS
20.0000 mg | ORAL_TABLET | Freq: Every day | ORAL | Status: DC
  Administered 2013-10-02 – 2013-10-03 (×2): 20 mg via ORAL
  Filled 2013-10-02 (×2): qty 1

## 2013-10-02 MED ORDER — DOPAMINE-DEXTROSE 3.2-5 MG/ML-% IV SOLN
3.0000 ug/kg/min | INTRAVENOUS | Status: DC
  Filled 2013-10-02: qty 250

## 2013-10-02 MED ORDER — IODIXANOL 320 MG/ML IV SOLN
INTRAVENOUS | Status: DC | PRN
  Administered 2013-10-02: 97.4 mL via INTRAVENOUS

## 2013-10-02 MED ORDER — PROTAMINE SULFATE 10 MG/ML IV SOLN
INTRAVENOUS | Status: AC
  Filled 2013-10-02: qty 5

## 2013-10-02 MED ORDER — PROPOFOL INFUSION 10 MG/ML OPTIME
INTRAVENOUS | Status: DC | PRN
  Administered 2013-10-02: 25 ug/kg/min via INTRAVENOUS

## 2013-10-02 MED ORDER — FENTANYL CITRATE 0.05 MG/ML IJ SOLN
INTRAMUSCULAR | Status: AC
  Filled 2013-10-02: qty 2

## 2013-10-02 MED ORDER — PHENOL 1.4 % MT LIQD: 1.0000 | OROMUCOSAL | Status: DC | PRN

## 2013-10-02 MED ORDER — IPRATROPIUM-ALBUTEROL 0.5-2.5 (3) MG/3ML IN SOLN
3.0000 mL | Freq: Two times a day (BID) | RESPIRATORY_TRACT | Status: DC
  Administered 2013-10-02 – 2013-10-03 (×2): 3 mL via RESPIRATORY_TRACT
  Filled 2013-10-02 (×2): qty 3

## 2013-10-02 MED ORDER — OXYCODONE HCL 5 MG PO TABS
5.0000 mg | ORAL_TABLET | Freq: Once | ORAL | Status: AC | PRN
  Administered 2013-10-02: 5 mg via ORAL

## 2013-10-02 MED ORDER — ALUM & MAG HYDROXIDE-SIMETH 200-200-20 MG/5ML PO SUSP: 15.0000 mL | ORAL | Status: DC | PRN

## 2013-10-02 MED ORDER — OXYCODONE HCL 5 MG PO TABS
5.0000 mg | ORAL_TABLET | ORAL | Status: DC | PRN
  Administered 2013-10-02 – 2013-10-03 (×2): 5 mg via ORAL
  Filled 2013-10-02 (×3): qty 1

## 2013-10-02 MED ORDER — DOCUSATE SODIUM 100 MG PO CAPS: 100.0000 mg | ORAL_CAPSULE | Freq: Every day | ORAL | Status: DC

## 2013-10-02 MED ORDER — HEPARIN SODIUM (PORCINE) 1000 UNIT/ML IJ SOLN
INTRAMUSCULAR | Status: DC | PRN
  Administered 2013-10-02: 7 mL via INTRAVENOUS

## 2013-10-02 MED ORDER — GABAPENTIN 300 MG PO CAPS
300.0000 mg | ORAL_CAPSULE | Freq: Three times a day (TID) | ORAL | Status: DC | PRN
  Filled 2013-10-02: qty 1

## 2013-10-02 MED ORDER — LACTATED RINGERS IV SOLN
INTRAVENOUS | Status: DC
  Administered 2013-10-02 (×2): via INTRAVENOUS

## 2013-10-02 MED ORDER — MIDAZOLAM HCL 5 MG/5ML IJ SOLN
INTRAMUSCULAR | Status: DC | PRN
  Administered 2013-10-02 (×4): 0.5 mg via INTRAVENOUS

## 2013-10-02 MED ORDER — GUAIFENESIN-DM 100-10 MG/5ML PO SYRP: 15.0000 mL | ORAL_SOLUTION | ORAL | Status: DC | PRN

## 2013-10-02 MED ORDER — OXYCODONE HCL 5 MG/5ML PO SOLN: 5.0000 mg | Freq: Once | ORAL | Status: AC | PRN

## 2013-10-02 MED ORDER — POTASSIUM CHLORIDE CRYS ER 20 MEQ PO TBCR: 20.0000 meq | EXTENDED_RELEASE_TABLET | Freq: Every day | ORAL | Status: DC | PRN

## 2013-10-02 MED ORDER — GLYCOPYRROLATE 0.2 MG/ML IJ SOLN
INTRAMUSCULAR | Status: AC
  Filled 2013-10-02: qty 1

## 2013-10-02 MED ORDER — LIDOCAINE HCL (CARDIAC) 20 MG/ML IV SOLN
INTRAVENOUS | Status: DC | PRN
Start: 2013-10-02 — End: 2013-10-02
  Administered 2013-10-02 (×2): 50 mg via INTRAVENOUS

## 2013-10-02 MED ORDER — 0.9 % SODIUM CHLORIDE (POUR BTL) OPTIME
TOPICAL | Status: DC | PRN
  Administered 2013-10-02: 1000 mL

## 2013-10-02 MED ORDER — PANTOPRAZOLE SODIUM 40 MG PO TBEC
40.0000 mg | DELAYED_RELEASE_TABLET | Freq: Every day | ORAL | Status: DC
  Administered 2013-10-02 – 2013-10-03 (×2): 40 mg via ORAL
  Filled 2013-10-02 (×2): qty 1

## 2013-10-02 SURGICAL SUPPLY — 79 items
ADH SKN CLS APL DERMABOND .7 (GAUZE/BANDAGES/DRESSINGS) ×2
BAG BANDED W/RUBBER/TAPE 36X54 (MISCELLANEOUS) ×3 IMPLANT
BAG EQP BAND 135X91 W/RBR TAPE (MISCELLANEOUS) ×1
BAG SNAP BAND KOVER 36X36 (MISCELLANEOUS) ×9 IMPLANT
BLADE SURG CLIPPER 3M 9600 (MISCELLANEOUS) ×3 IMPLANT
CANISTER SUCTION 2500CC (MISCELLANEOUS) ×3 IMPLANT
CATH BEACON 5.038 65CM KMP-01 (CATHETERS) IMPLANT
CATH OMNI FLUSH .035X70CM (CATHETERS) ×2 IMPLANT
CLIP TI MEDIUM 24 (CLIP) IMPLANT
CLIP TI WIDE RED SMALL 24 (CLIP) IMPLANT
COVER DOME SNAP 22 D (MISCELLANEOUS) ×5 IMPLANT
COVER MAYO STAND STRL (DRAPES) ×3 IMPLANT
COVER PROBE W GEL 5X96 (DRAPES) ×3 IMPLANT
COVER SURGICAL LIGHT HANDLE (MISCELLANEOUS) ×3 IMPLANT
DERMABOND ADVANCED (GAUZE/BANDAGES/DRESSINGS) ×4
DERMABOND ADVANCED .7 DNX12 (GAUZE/BANDAGES/DRESSINGS) ×1 IMPLANT
DEVICE CLOSURE PERCLS PRGLD 6F (VASCULAR PRODUCTS) IMPLANT
DRAPE TABLE COVER HEAVY DUTY (DRAPES) ×3 IMPLANT
DRSG TEGADERM 2-3/8X2-3/4 SM (GAUZE/BANDAGES/DRESSINGS) ×6 IMPLANT
DRYSEAL FLEXSHEATH 12FR 33CM (SHEATH) ×2
DRYSEAL FLEXSHEATH 18FR 33CM (SHEATH) ×2
ELECT REM PT RETURN 9FT ADLT (ELECTROSURGICAL) ×6
ELECTRODE REM PT RTRN 9FT ADLT (ELECTROSURGICAL) ×2 IMPLANT
EXCLUDER TNK LEG 35MX14X16 (Endovascular Graft) IMPLANT
EXCLUDER TRUNK LEG 35MX14X16 (Endovascular Graft) ×3 IMPLANT
GAUZE SPONGE 2X2 8PLY STRL LF (GAUZE/BANDAGES/DRESSINGS) ×2 IMPLANT
GLOVE BIO SURGEON STRL SZ 6.5 (GLOVE) ×2 IMPLANT
GLOVE BIO SURGEON STRL SZ7.5 (GLOVE) ×2 IMPLANT
GLOVE BIO SURGEONS STRL SZ 6.5 (GLOVE) ×2
GLOVE BIOGEL PI IND STRL 7.5 (GLOVE) ×1 IMPLANT
GLOVE BIOGEL PI IND STRL 8 (GLOVE) IMPLANT
GLOVE BIOGEL PI INDICATOR 7.5 (GLOVE) ×2
GLOVE BIOGEL PI INDICATOR 8 (GLOVE) ×4
GLOVE ECLIPSE 7.5 STRL STRAW (GLOVE) ×2 IMPLANT
GLOVE SURG SS PI 7.5 STRL IVOR (GLOVE) ×3 IMPLANT
GOWN STRL REUS W/ TWL LRG LVL3 (GOWN DISPOSABLE) ×2 IMPLANT
GOWN STRL REUS W/ TWL XL LVL3 (GOWN DISPOSABLE) ×1 IMPLANT
GOWN STRL REUS W/TWL LRG LVL3 (GOWN DISPOSABLE) ×3
GOWN STRL REUS W/TWL XL LVL3 (GOWN DISPOSABLE) ×12
GRAFT BALLN CATH 65CM (STENTS) IMPLANT
HEMOSTAT SNOW SURGICEL 2X4 (HEMOSTASIS) IMPLANT
KIT BASIN OR (CUSTOM PROCEDURE TRAY) ×3 IMPLANT
KIT ROOM TURNOVER OR (KITS) ×3 IMPLANT
LEG CONTRALATERAL 16X18X9.5 (Endovascular Graft) ×2 IMPLANT
NDL PERC 18GX7CM (NEEDLE) ×1 IMPLANT
NEEDLE PERC 18GX7CM (NEEDLE) ×3 IMPLANT
NS IRRIG 1000ML POUR BTL (IV SOLUTION) ×3 IMPLANT
PACK AORTA (CUSTOM PROCEDURE TRAY) ×3 IMPLANT
PAD ARMBOARD 7.5X6 YLW CONV (MISCELLANEOUS) ×6 IMPLANT
PENCIL BUTTON HOLSTER BLD 10FT (ELECTRODE) IMPLANT
PERCLOSE PROGLIDE 6F (VASCULAR PRODUCTS) ×15
PROTECTION STATION PRESSURIZED (MISCELLANEOUS) ×3
SHEATH AVANTI 11CM 8FR (MISCELLANEOUS) ×2 IMPLANT
SHEATH BRITE TIP 8FR 23CM (MISCELLANEOUS) ×2 IMPLANT
SHEATH DRYSEAL FLEX 12FR 33CM (SHEATH) IMPLANT
SHEATH DRYSEAL FLEX 18FR 33CM (SHEATH) IMPLANT
SHIELD RADPAD SCOOP 12X17 (MISCELLANEOUS) ×4 IMPLANT
SPONGE GAUZE 2X2 STER 10/PKG (GAUZE/BANDAGES/DRESSINGS) ×4
STATION PROTECTION PRESSURIZED (MISCELLANEOUS) IMPLANT
STENT GRAFT BALLN CATH 65CM (STENTS) ×2
STOPCOCK MORSE 400PSI 3WAY (MISCELLANEOUS) ×3 IMPLANT
SUT ETHILON 3 0 PS 1 (SUTURE) IMPLANT
SUT PROLENE 5 0 C 1 24 (SUTURE) IMPLANT
SUT VIC AB 2-0 CT1 27 (SUTURE)
SUT VIC AB 2-0 CT1 TAPERPNT 27 (SUTURE) IMPLANT
SUT VIC AB 3-0 SH 27 (SUTURE)
SUT VIC AB 3-0 SH 27X BRD (SUTURE) IMPLANT
SUT VICRYL 4-0 PS2 18IN ABS (SUTURE) ×6 IMPLANT
SYR 20CC LL (SYRINGE) ×6 IMPLANT
SYR 30ML LL (SYRINGE) IMPLANT
SYR 5ML LL (SYRINGE) IMPLANT
SYR MEDRAD MARK V 150ML (SYRINGE) ×3 IMPLANT
SYRINGE 10CC LL (SYRINGE) ×9 IMPLANT
TOWEL OR 17X24 6PK STRL BLUE (TOWEL DISPOSABLE) ×6 IMPLANT
TOWEL OR 17X26 10 PK STRL BLUE (TOWEL DISPOSABLE) ×10 IMPLANT
TRAY FOLEY CATH 16FRSI W/METER (SET/KITS/TRAYS/PACK) ×3 IMPLANT
TUBING HIGH PRESSURE 120CM (CONNECTOR) ×3 IMPLANT
WIRE AMPLATZ SS-J .035X180CM (WIRE) ×4 IMPLANT
WIRE BENTSON .035X145CM (WIRE) ×4 IMPLANT

## 2013-10-02 NOTE — Progress Notes (Signed)
Report given to robin rn as caregiver

## 2013-10-02 NOTE — Anesthesia Preprocedure Evaluation (Addendum)
Anesthesia Evaluation  Patient identified by MRN, date of birth, ID band Patient awake    Reviewed: Allergy & Precautions, H&P , NPO status , Patient's Chart, lab work & pertinent test results  History of Anesthesia Complications Negative for: history of anesthetic complications  Airway Mallampati: II TM Distance: >3 FB Neck ROM: Full    Dental  (+) Missing   Pulmonary shortness of breath, with exertion and at rest, asthma , neg sleep apnea, COPD COPD inhaler, Current Smoker,  breath sounds clear to auscultation        Cardiovascular hypertension, Pt. on medications - angina+ Peripheral Vascular Disease - Past MI - dysrhythmias Rhythm:Regular  Juxta renal AAA   Neuro/Psych Seizures -, Well Controlled,  PSYCHIATRIC DISORDERS Anxiety Depression    GI/Hepatic Neg liver ROS, GERD-  Medicated and Controlled,  Endo/Other  negative endocrine ROS  Renal/GU negative Renal ROS     Musculoskeletal  (+) Arthritis -,   Abdominal   Peds  Hematology  (+) anemia ,   Anesthesia Other Findings   Reproductive/Obstetrics                         Anesthesia Physical Anesthesia Plan  ASA: II  Anesthesia Plan: MAC and Spinal   Post-op Pain Management:    Induction:   Airway Management Planned: Natural Airway and Simple Face Mask  Additional Equipment: Arterial line  Intra-op Plan:   Post-operative Plan:   Informed Consent: I have reviewed the patients History and Physical, chart, labs and discussed the procedure including the risks, benefits and alternatives for the proposed anesthesia with the patient or authorized representative who has indicated his/her understanding and acceptance.   Dental advisory given  Plan Discussed with: CRNA and Surgeon  Anesthesia Plan Comments:        Anesthesia Quick Evaluation

## 2013-10-02 NOTE — Interval H&P Note (Signed)
History and Physical Interval Note:  10/02/2013 11:56 AM  [name].  has presented today for surgery, with the diagnosis of Abdominal aneurysm without mention of rupture   The various methods of treatment have been discussed with the patient and family. After consideration of risks, benefits and other options for treatment, the patient has consented to  Procedure(s): ABDOMINAL AORTIC ENDOVASCULAR STENT GRAFT (N/A) as a surgical intervention .  The patient's history has been reviewed, patient examined, no change in status, stable for surgery.  I have reviewed the patient's chart and labs.  Questions were answered to the patient's satisfaction.     BRABHAM IV, V. WELLS

## 2013-10-02 NOTE — H&P (View-Only) (Signed)
Patient name: [name]. MRN: [id number] DOB: 07/08/1945 Sex: male     Chief Complaint  Patient presents with  . Re-evaluation    1 wk f/u CTA abd/pel     HISTORY OF PRESENT ILLNESS: The patient returns today for review of the angiogram for his abdominal aortic aneurysm.He denies back pain or abdominal pain. He continues to be managed medically for hyperlipidemia with a statin. His hypertension is managed with an ACE inhibitor. He suffers from COPD but is not on oxygen. He continues to smoke occasionally.   Past Medical History  Diagnosis Date  . Hypertension   . Hyperlipidemia   . COPD (chronic obstructive pulmonary disease)   . Peripheral vascular disease   . AAA (abdominal aortic aneurysm)   . 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 Arthritis   . Hepatitis     Hx of Hep A  . Neuropathy     Past Surgical History  Procedure Laterality Date  . Rotator cuff repair  1992    Right shoulder  . Cataract extraction w/phaco Right 05/06/2012    Procedure: CATARACT EXTRACTION PHACO AND INTRAOCULAR LENS PLACEMENT (IOC);  Surgeon: Tonny Branch, MD;  Location: [facility];  Service: Ophthalmology;  Laterality: Right;  CDE:13.61  . Cataract extraction w/phaco Left 05/20/2012    Procedure: CATARACT EXTRACTION PHACO AND INTRAOCULAR LENS PLACEMENT (IOC);  Surgeon: Tonny Branch, MD;  Location: [facility];  Service: Ophthalmology;  Laterality: Left;  CDE: 12.32  . Eye surgery      History   Social History  . Marital Status: Married    Spouse Name: N/A    Number of Children: N/A  . Years of Education: N/A   Occupational History  . Not on file.   Social History Main Topics  . Smoking status: Current Some Day Smoker -- 0.01 packs/day for 40 years    Types: Cigarettes  . Smokeless tobacco: Never Used     Comment: pt states that he only smokes about 2-3 cigs per day  . Alcohol Use: No  . 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 none in 7 years  . Sexual Activity: Yes    Birth Control/ Protection: None   Other Topics Concern  . Not on file   Social History Narrative  . No narrative on file    Family History  Problem Relation Age of Onset  . Hypertension Mother   . Hyperlipidemia Mother   . Cancer Mother   . Heart disease Father   . Heart attack Father   . Hyperlipidemia Father   . Hypertension Father   . Diabetes Father   . Diabetes Sister   . Cancer Sister   . Hyperlipidemia Sister   . Hypertension Sister   . Cancer Brother   . Hyperlipidemia Brother   . Hypertension Brother     Allergies as of 09/22/2013 - Review Complete 09/22/2013  Allergen Reaction Noted  . Sulfa antibiotics Hives 02/22/2011    Current Outpatient Prescriptions on File Prior to Visit  Medication Sig Dispense Refill  . ADVAIR DISKUS 250-50 MCG/DOSE AEPB Inhale 1 puff into the lungs 2 (two) times daily as needed.       . COMBIVENT RESPIMAT 20-100 MCG/ACT AERS respimat Inhale 1 puff into the lungs as needed for wheezing or shortness of breath.       . doxazosin (  CARDURA) 4 MG tablet Take 4 mg by mouth at bedtime.      . gabapentin (NEURONTIN) 300 MG capsule Take 300 mg by mouth 3 (three) times daily.      Marland Kitchen lisinopril (PRINIVIL,ZESTRIL) 10 MG tablet Take 10 mg by mouth daily.      Marland Kitchen loratadine (CLARITIN) 10 MG tablet Take 10 mg by mouth daily as needed for allergies.       Marland Kitchen omeprazole (PRILOSEC) 40 MG capsule Take 40 mg by mouth daily.       . predniSONE (DELTASONE) 20 MG tablet Take 20 mg by mouth as needed (for asthma flare ups).       Marland Kitchen PROAIR HFA 108 (90 BASE) MCG/ACT inhaler continuous as needed.      . simvastatin (ZOCOR) 20 MG tablet Take 20 mg by mouth every evening.       No current facility-administered medications on file prior to visit.     REVIEW OF SYSTEMS: No changes from the visit last week  PHYSICAL EXAMINATION:   Vital signs are BP 129/84  Pulse 56  Ht 5\' 11"  (1.803 m)  Wt 155 lb 6.4  oz (70.489 kg)  BMI 21.68 kg/m2  SpO2 100% General: The patient appears their stated age. HEENT:  No gross abnormalities Pulmonary:  Non labored breathing Abdomen: Soft and non-tender.  Aorta is easily palpated and nontender  Musculoskeletal: There are no major deformities. Neurologic: No focal weakness or paresthesias are detected, Skin: There are no ulcer or rashes noted. Psychiatric: The patient has normal affect. Cardiovascular: There is a regular rate and rhythm without significant murmur palpable pedal pulses    Diagnostic Studies I have reviewed his CT angiogram which reveals a 5 cm infrarenal aneurysm Carotid Doppler has been reviewed by myself which showed less than 40% stenosis bilaterally. Assessment: Abdominal aortic aneurysm Plan: The patient's aneurysm now measures 5 cm.  I have recommended repair.  I think he can be done using a stent graft.  We discussed the risks and benefits including the risk of cardiopulmonary complications, bleeding, death, intestinal ischemia, lower extremity ischemia, and renal dysfunction.  I am going to get a Myoview prior to his operation which is been scheduled for Thursday, September 10.  Eldridge Abrahams, M.D. [facility] [place] Office: [phone] Pager:  [phone]

## 2013-10-02 NOTE — Op Note (Signed)
Patient name: [name]. MRN: [id number] DOB: [birth year] Sex: male  10/02/2013 Pre-operative Diagnosis: AAA Post-operative diagnosis:  Same Surgeon:  Eldridge Abrahams Assistants:  Leontine Locket Procedure:   #1: Endovascular repair of abdominal aortic aneurysm   #2: Ultrasound-guided bilateral percutaneous access, common femoral artery   #3: Catheter in aorta x2   #4: Abdominal aortogram Anesthesia:  Spinal Blood Loss:  See anesthesia record Specimens:  None  Findings:  Complete exclusion, late type II endoleak  Indications:  This is a 68 year old gentleman with a 5 cm abdominal aortic aneurysm.  He is here today for endovascular repair.  Procedure:  The patient was identified in the holding area and taken to [facility]  The patient was then placed supine on the table. spinal anesthesia was administered.  The patient was prepped and draped in the usual sterile fashion.  A time out was called and antibiotics were administered.  Ultrasound was used to evaluate bilateral common femoral arteries.  They were mildly calcified but patent.  A #11 blade was used to make a skin incision.  Bilateral common femoral arteries were then cannulated under ultrasound guidance with an 18-gauge needle.  An 035 wires were advanced into the aorta without resistance.  An 8 [place] dilator was used to dilate the subcutaneous tract.  Pro-glide devices were placed at the 11:00 and 1:00 position for pre-closure.  The patient was fully heparinized.  Over an Amplatz superstiff wire, a 12 French sheath was inserted up the right leg.  Next over an Amplatz superstiff wire, an 65 French sheath was advanced into the abdominal aorta from the left.  A pigtail catheter was placed at the level of L1.  The main body device was prepared on the back table.  This was a Dealer 35 x 14 x 16.  It was advanced into the aorta.  An abdominal aortogram was performed locating the lowest renal artery.  The device was then  deployed landing at this level.  Next using a Omni flush catheter and a Benson wire, the contralateral gate was cannulated.  The catheter was able to be freely rotated within the main body, confirming successful cannulation.  An Amplatz superstiff wire was then placed.  The image to take it was rotated to a left anterior oblique position and a retrograde injection was performed through the sheath on the right side, locating the right hypogastric artery.  A 18 x 9.5 device was selected and deployed landing at the level hypogastric artery.  Next the image that was rotated to a right anterior oblique position a retrograde injection from the left sheath was performed locating the left hypogastric artery.  The main body ipsilateral side was then deployed.  AQ-50 balloon was used to mold the proximal and distal attachment sites as well as device overlap.  Completion imaging revealed successful endovascular exclusion of the aneurysm with late type II endoleak from lumbar arteries.  Bilateral renal arteries remained patent as did bilateral hypogastric arteries.  Next the stiff wires were exchanged out for 035 Bentson wires.  The sheaths were withdrawn and the groin arteriotomy sites closed using the previously placed pro-glide devices.  50 mg of protamine was then administered.  Once hemostasis was satisfactory, the groin incisions were closed with 4-0 Vicryl and Dermabond.  There were no immediate complications.    Disposition:  To PACU in stable condition.   Theotis Burrow, M.D. [facility] [place] Office: [phone] Pager:  [phone] 

## 2013-10-02 NOTE — Anesthesia Procedure Notes (Signed)
Spinal  Patient location during procedure: pre-op Start time: 10/02/2013 11:54 AM End time: 10/02/2013 11:58 AM Staffing Anesthesiologist: Chaunda Vandergriff, CHRIS Preanesthetic Checklist Completed: patient identified, surgical consent, pre-op evaluation, timeout performed, IV checked, risks and benefits discussed and monitors and equipment checked Spinal Block Patient position: sitting Prep: site prepped and draped and DuraPrep Patient monitoring: heart rate, cardiac monitor, continuous pulse ox and blood pressure Approach: midline Location: L4-5 Injection technique: single-shot Needle Needle type: Pencan  Needle gauge: 24 G Needle length: 10 cm Assessment Sensory level: T6

## 2013-10-02 NOTE — Transfer of Care (Signed)
Immediate Anesthesia Transfer of Care Note  Patient: [name].  Procedure(s) Performed: Procedure(s): ABDOMINAL AORTIC ENDOVASCULAR STENT GRAFT (N/A)  Patient Location: PACU  Anesthesia Type:MAC and Spinal  Level of Consciousness: awake, alert , oriented, patient cooperative and responds to stimulation  Airway & Oxygen Therapy: Patient Spontanous Breathing  Post-op Assessment: Report given to PACU RN and Post -op Vital signs reviewed and stable  Post vital signs: Reviewed and stable  Complications: No apparent anesthesia complications

## 2013-10-03 ENCOUNTER — Telehealth: Payer: Self-pay | Admitting: Surgery

## 2013-10-03 LAB — BASIC METABOLIC PANEL
Anion gap: 11 (ref 5–15)
BUN: 15 mg/dL (ref 6–23)
CHLORIDE: 104 meq/L (ref 96–112)
CO2: 23 meq/L (ref 19–32)
CREATININE: 1.02 mg/dL (ref 0.50–1.35)
Calcium: 8.4 mg/dL (ref 8.4–10.5)
GFR calc Af Amer: 85 mL/min — ABNORMAL LOW (ref >90)
GFR calc non Af Amer: 73 mL/min — ABNORMAL LOW (ref >90)
Glucose, Bld: 87 mg/dL (ref 70–99)
POTASSIUM: 4.1 meq/L (ref 3.7–5.3)
Sodium: 138 mEq/L (ref 137–147)

## 2013-10-03 LAB — CBC
HEMATOCRIT: 35.1 % — AB (ref 39.0–52.0)
HEMOGLOBIN: 11.8 g/dL — AB (ref 13.0–17.0)
MCH: 29.1 pg (ref 26.0–34.0)
MCHC: 33.6 g/dL (ref 30.0–36.0)
MCV: 86.7 fL (ref 78.0–100.0)
Platelets: 126 10*3/uL — ABNORMAL LOW (ref 150–400)
RBC: 4.05 MIL/uL — AB (ref 4.22–5.81)
RDW: 14.6 % (ref 11.5–15.5)
WBC: 6.7 10*3/uL (ref 4.0–10.5)

## 2013-10-03 NOTE — Progress Notes (Addendum)
  AAA Progress Note    10/03/2013 7:22 AM 1 Day Post-Op  Subjective:  No complaints  Afebrile HR 50's-90's regular 712'W-580'D systolic 98% RA  Filed Vitals:   10/03/13 0540  BP:   Pulse: 65  Temp:   Resp: 15    Physical Exam: Cardiac:  regular Lungs:  Non labored Abdomen:  Soft, NT Incisions:  Bilateral groins are soft without hematoma Extremities:  Palpable bilateral DP/PT  CBC    Component Value Date/Time   WBC 6.7 10/03/2013 0425   RBC 4.05* 10/03/2013 0425   HGB 11.8* 10/03/2013 0425   HCT 35.1* 10/03/2013 0425   PLT 126* 10/03/2013 0425   MCV 86.7 10/03/2013 0425   MCH 29.1 10/03/2013 0425   MCHC 33.6 10/03/2013 0425   RDW 14.6 10/03/2013 0425    BMET    Component Value Date/Time   NA 138 10/03/2013 0425   K 4.1 10/03/2013 0425   CL 104 10/03/2013 0425   CO2 23 10/03/2013 0425   GLUCOSE 87 10/03/2013 0425   BUN 15 10/03/2013 0425   CREATININE 1.02 10/03/2013 0425   CREATININE 0.95 09/17/2013 1407   CALCIUM 8.4 10/03/2013 0425   GFRNONAA 73* 10/03/2013 0425   GFRAA 85* 10/03/2013 0425    INR    Component Value Date/Time   INR 1.34 10/02/2013 1500     Intake/Output Summary (Last 24 hours) at 10/03/13 3382 Last data filed at 10/03/13 0400  Gross per 24 hour  Intake 2057.5 ml  Output   2650 ml  Net -592.5 ml     Assessment/Plan:  68 y.o. male is s/p  #1: Endovascular repair of abdominal aortic aneurysm  #2: Ultrasound-guided bilateral percutaneous access, common femoral artery  #3: Catheter in aorta x2  #4: Abdominal aortogram 1 Day Post-Op  -pt doing well this am -he has ambulated around the unit 2x -foley out this am - he needs to void before discharge -slight increase in Cr, but has good UOP -f/u with Dr. Trula Slade in 4 weeks with CTA    Leontine Locket, PA-C [facility] [phone] 10/03/2013 7:22 AM  Agree with the above Looks great Voided, walked, nd ate Incisions soft Home today  Annamarie Major

## 2013-10-03 NOTE — Progress Notes (Signed)
Utilization review completed. Jazma Pickel, RN, BSN. 

## 2013-10-03 NOTE — Anesthesia Postprocedure Evaluation (Signed)
  Anesthesia Post-op Note  Patient: [name].  Procedure(s) Performed: Procedure(s): ABDOMINAL AORTIC ENDOVASCULAR STENT GRAFT (N/A)  Patient Location: PACU  Anesthesia Type:MAC and Spinal  Level of Consciousness: awake and alert   Airway and Oxygen Therapy: Patient Spontanous Breathing  Post-op Pain: mild  Post-op Assessment: Post-op Vital signs reviewed, Patient's Cardiovascular Status Stable, Respiratory Function Stable, Patent Airway, No signs of Nausea or vomiting and Pain level controlled  Post-op Vital Signs: Reviewed and stable  Last Vitals:  Filed Vitals:   10/03/13 0759  BP:   Pulse:   Temp: 36.9 C  Resp:     Complications: No apparent anesthesia complications

## 2013-10-03 NOTE — Telephone Encounter (Signed)
Message copied by Gena Fray on Fri Oct 03, 2013  3:31 PM ------      Message from: Peter Minium K      Created: Thu Oct 02, 2013  3:05 PM      Regarding: Gennette Pac                   ----- Message -----         From: Gabriel Earing, PA-C         Sent: 10/02/2013   2:43 PM           To: [facility] Charge Pool            S/p EVAR 10/02/13.  F/u with Dr. Trula Slade in 4 weeks with CTA protocol.            Thanks,      Aldona Bar ------

## 2013-10-03 NOTE — Discharge Summary (Signed)
[facility] Discharge Summary   [name]. [birth year] 68 y.o. male  [id number]  Admission Date: 10/02/2013  Discharge Date: 10/03/13  Physician: Serafina Mitchell, MD  Admission Diagnosis: Abdominal aneurysm without mention of rupture    HPI:   This is a 68 y.o. male who returns today for review of the angiogram for his abdominal aortic aneurysm.He denies back pain or abdominal pain. He continues to be managed medically for hyperlipidemia with a statin. His hypertension is managed with an ACE inhibitor. He suffers from COPD but is not on oxygen. He continues to smoke occasionally.  Hospital Course:  The patient was admitted to the hospital and taken to the operating room on 10/02/2013 and underwent: #1: Endovascular repair of abdominal aortic aneurysm  #2: Ultrasound-guided bilateral percutaneous access, common femoral artery  #3: Catheter in aorta x2  #4: Abdominal aortogram    The pt tolerated the procedure well and was transported to the PACU in good condition. By POD 1, he is doing well without issues.  The remainder of the hospital course consisted of increasing mobilization and increasing intake of solids without difficulty.  CBC    Component Value Date/Time   WBC 6.7 10/03/2013 0425   RBC 4.05* 10/03/2013 0425   HGB 11.8* 10/03/2013 0425   HCT 35.1* 10/03/2013 0425   PLT 126* 10/03/2013 0425   MCV 86.7 10/03/2013 0425   MCH 29.1 10/03/2013 0425   MCHC 33.6 10/03/2013 0425   RDW 14.6 10/03/2013 0425    BMET    Component Value Date/Time   NA 138 10/03/2013 0425   K 4.1 10/03/2013 0425   CL 104 10/03/2013 0425   CO2 23 10/03/2013 0425   GLUCOSE 87 10/03/2013 0425   BUN 15 10/03/2013 0425   CREATININE 1.02 10/03/2013 0425   CREATININE 0.95 09/17/2013 1407   CALCIUM 8.4 10/03/2013 0425   GFRNONAA 73* 10/03/2013 0425   GFRAA 85* 10/03/2013 0425     Discharge Instructions:   The patient is discharged with extensive instructions on wound care and progressive ambulation.  They  are instructed not to drive or perform any heavy lifting until returning to see the physician in his office.  Discharge Instructions   ABDOMINAL PROCEDURE/ANEURYSM REPAIR/AORTO-BIFEMORAL BYPASS:  Call MD for increased abdominal pain; cramping diarrhea; nausea/vomiting    Complete by:  As directed      Call MD for:  redness, tenderness, or signs of infection (pain, swelling, bleeding, redness, odor or green/yellow discharge around incision site)    Complete by:  As directed      Call MD for:  severe or increased pain, loss or decreased feeling  in affected limb(s)    Complete by:  As directed      Call MD for:  temperature >100.5    Complete by:  As directed      Discharge wound care:    Complete by:  As directed   Shower daily with soap and water starting 10/04/13     Driving Restrictions    Complete by:  As directed   No driving for 2 weeks     Lifting restrictions    Complete by:  As directed   No lifting for 4 weeks     Resume previous diet    Complete by:  As directed            Discharge Diagnosis:  Abdominal aneurysm without mention of rupture   Secondary Diagnosis: Patient Active Problem List   Diagnosis Date Noted  .  AAA (abdominal aortic aneurysm) 10/02/2013  . Cramping of feet- and Hands 12/23/2012  . Occlusion and stenosis of carotid artery without mention of cerebral infarction 10/02/2011  . Abdominal aneurysm without mention of rupture 03/20/2011   Past Medical History  Diagnosis Date  . Hyperlipidemia     takes Zocor daily  . Peripheral vascular disease   . AAA (abdominal aortic aneurysm)   . Arthritis   . Hepatitis     Hx of Hep A  . Neuropathy     takes Gabapentin daily as needed  . GERD (gastroesophageal reflux disease)     takes Zantac daily  . Hypertension     takes Cardura daily  . COPD (chronic obstructive pulmonary disease)     Duoneb daily as well as Advair  . Asthma   . Emphysema lung   . Shortness of breath     with exertion  .  Pneumonia 2008  . History of bronchitis 2008  . AAA (abdominal aortic aneurysm)   . Seizures     in 1992 only had one;was placed on meds but has been off over 1yrs ago  . Joint pain   . Chronic back pain     buldging disc  . History of gastric ulcer   . History of colon polyps   . Anxiety   . Depression     after accident over 29yrs ago but not on any meds  . History of shingles        Medication List         doxazosin 4 MG tablet  Commonly known as:  CARDURA  Take 4 mg by mouth at bedtime.     Fluticasone-Salmeterol 500-50 MCG/DOSE Aepb  Commonly known as:  ADVAIR  Inhale 1 puff into the lungs 2 (two) times daily.     gabapentin 300 MG capsule  Commonly known as:  NEURONTIN  Take 300 mg by mouth 3 (three) times daily as needed (pain).     ipratropium-albuterol 0.5-2.5 (3) MG/3ML Soln  Commonly known as:  DUONEB  Take 3 mLs by nebulization 2 (two) times daily.     loratadine 10 MG tablet  Commonly known as:  CLARITIN  Take 10 mg by mouth daily as needed for allergies.     oxyCODONE 5 MG immediate release tablet  Commonly known as:  ROXICODONE  Take 1 tablet (5 mg total) by mouth every 6 (six) hours as needed for severe pain.     PROAIR HFA 108 (90 BASE) MCG/ACT inhaler  Generic drug:  albuterol  Inhale 2 puffs into the lungs 4 (four) times daily as needed for wheezing or shortness of breath.     simvastatin 20 MG tablet  Commonly known as:  ZOCOR  Take 20 mg by mouth at bedtime.     ZANTAC 150 MG tablet  Generic drug:  ranitidine  Take 150 mg by mouth every morning. Equate brand        Roxicodone #20 No Refill  Disposition: home  Patient's condition: is Good  Follow up: 1. Dr. Trula Slade in 4 weeks with CTA   Leontine Locket, PA-C [facility] [phone] 10/03/2013  7:33 AM   - For VQI Registry use --- Instructions: Press F2 to tab through selections.  Delete question if not applicable.   Post-op:  Time to Extubation: [ ]   In OR, [ ]  < 12 hrs, [ ]  12-24 hrs, [ ]  >=24 hrs Vasopressors Req. Post-op: No MI: No., [ ]  Troponin  only, [ ]  EKG or Clinical New Arrhythmia: No CHF: No ICU Stay: 1 day in stepdown Transfusion: No  If yes, n/a units given  Complications: Resp failure: No., [ ]  Pneumonia, [ ]  Ventilator Chg in renal function: No., [ ]  Inc. Cr > 0.5, [ ]  Temp. Dialysis, [ ]  Permanent dialysis Leg ischemia: No., no Surgery needed, [ ]  Yes, Surgery needed, [ ]  Amputation Bowel ischemia: No., [ ]  Medical Rx, [ ]  Surgical Rx Wound complication: No., [ ]  Superficial separation/infection, [ ]  Return to OR Return to OR: No  Return to OR for bleeding: No Stroke: No., [ ]  Minor, [ ]  Major  Discharge medications: Statin use:  Yes If No: [ ]  For Medical reasons, [ ]  Non-compliant, [ ]  Not-indicated ASA use:  No  If No: [ ]  For Medical reasons, [ ]  Non-compliant, [ ]  Not-indicated Plavix use:  No If No: [ ]  For Medical reasons, [ ]  Non-compliant, [ ]  Not-indicated Beta blocker use:  No If No: [ ]  For Medical reasons, [ ]  Non-compliant, [ ]  Not-indicated

## 2013-10-03 NOTE — Telephone Encounter (Signed)
Spoke with pts spouse

## 2013-10-06 ENCOUNTER — Encounter (HOSPITAL_COMMUNITY): Payer: Self-pay | Admitting: Surgery

## 2013-10-07 NOTE — Discharge Summary (Signed)
I agree with the above  [name] 

## 2013-10-24 ENCOUNTER — Encounter: Payer: Self-pay | Admitting: Surgery

## 2013-10-27 ENCOUNTER — Ambulatory Visit
Admission: RE | Admit: 2013-10-27 | Discharge: 2013-10-27 | Disposition: A | Discharge status: Home / Self Care | Payer: Medicare HMO | Source: Ambulatory Visit | Attending: Surgery | Admitting: Surgery

## 2013-10-27 ENCOUNTER — Ambulatory Visit (INDEPENDENT_AMBULATORY_CARE_PROVIDER_SITE_OTHER): Payer: Self-pay | Admitting: Surgery

## 2013-10-27 ENCOUNTER — Encounter: Payer: Self-pay | Admitting: Surgery

## 2013-10-27 VITALS — BP 162/92 | HR 73 | Ht 71.0 in | Wt 153.6 lb

## 2013-10-27 DIAGNOSIS — I714 Abdominal aortic aneurysm, without rupture, unspecified: Secondary | ICD-10-CM

## 2013-10-27 DIAGNOSIS — Z48812 Encounter for surgical aftercare following surgery on the circulatory system: Secondary | ICD-10-CM

## 2013-10-27 MED ORDER — IOHEXOL 350 MG/ML SOLN
80.0000 mL | Freq: Once | INTRAVENOUS | Status: AC | PRN
  Administered 2013-10-27: 80 mL via INTRAVENOUS

## 2013-10-27 NOTE — Progress Notes (Signed)
POST OPERATIVE OFFICE NOTE    CC:  F/u for surgery  HPI:  This is a 68 y.o. male who is s/p EVAR on 10/02/13 by Dr. Trula Slade.  He states that he has done quite well since surgery.  He did have spinal anesthesia for his procedure.  He states that he has had some pains in his back that he didn't have prior to that.  He states this is continues to get better.    He is concerned that he did not get his inhalers when he was discharged from the hospital.  These were kept in pharmacy and they weren't given to him at discharge.   Allergies  Allergen Reactions  . Sulfa Antibiotics Itching and Rash    Current Outpatient Prescriptions  Medication Sig Dispense Refill  . doxazosin (CARDURA) 4 MG tablet Take 4 mg by mouth at bedtime.      . Fluticasone-Salmeterol (ADVAIR) 500-50 MCG/DOSE AEPB Inhale 1 puff into the lungs 2 (two) times daily.      Marland Kitchen gabapentin (NEURONTIN) 300 MG capsule Take 300 mg by mouth 3 (three) times daily as needed (pain).       Marland Kitchen ipratropium-albuterol (DUONEB) 0.5-2.5 (3) MG/3ML SOLN Take 3 mLs by nebulization 2 (two) times daily.      Marland Kitchen loratadine (CLARITIN) 10 MG tablet Take 10 mg by mouth daily as needed for allergies.       Marland Kitchen oxyCODONE (ROXICODONE) 5 MG immediate release tablet Take 1 tablet (5 mg total) by mouth every 6 (six) hours as needed for severe pain.  20 tablet  0  . PROAIR HFA 108 (90 BASE) MCG/ACT inhaler Inhale 2 puffs into the lungs 4 (four) times daily as needed for wheezing or shortness of breath.       . ranitidine (ZANTAC) 150 MG tablet Take 150 mg by mouth every morning. Equate brand      . simvastatin (ZOCOR) 20 MG tablet Take 20 mg by mouth at bedtime.        No current facility-administered medications for this visit.     ROS:  See HPI  Physical Exam:  Filed Vitals:   10/27/13 1516  BP: 162/92  Pulse: 73    Incision:  Bilateral groins are soft without hematoma Abdomen:  Soft, NT/ND   A/P:  This is a 68 y.o. male here for f/u for  EVAR  -pt is doing well, however, he does have a pain in his back that was not present before his spinal anesthesia that has continued to get better since surgery.  Dr. Trula Slade thinks this will continue to improve. -I did call the inpatient pharmacy and meds are discarded at discharge.  Advised pt to f/u with his primary care MD and research assistance programs as he states the price of these have increased.  His daughter works for [employer] and has given him a discount med card.  He will try this as well. -f/u in 6 months with ultrasound of AAA   Leontine Locket, PA-C [facility] [phone]  Clinic MD:  Pt seen and examined with Dr. Trula Slade  I agree with the above.  I have seen and examined the patient. He is status post endovascular aneurysm repair on 10/02/2013.  This was done under spinal anesthesia.  The patient reports no complaints with the exception of back pain at night which has been getting better.  His groin incisions have healed completely.  Ultrasound today shows complete exclusion of the aneurysm without evidence of  endoleak.  The patient will followup in 6 positive for an ultrasound.  Annamarie Major

## 2013-10-28 NOTE — Addendum Note (Signed)
Addended by: Dorthula Rue L on: 10/28/2013 04:41 PM   Modules accepted: Orders

## 2013-11-03 ENCOUNTER — Other Ambulatory Visit: Payer: Medicare HMO

## 2013-11-03 ENCOUNTER — Encounter: Payer: Medicare HMO | Admitting: Surgery

## 2014-04-27 ENCOUNTER — Other Ambulatory Visit (HOSPITAL_COMMUNITY): Payer: Medicare HMO

## 2014-04-27 ENCOUNTER — Ambulatory Visit: Payer: Medicare HMO | Admitting: Surgery

## 2014-05-04 ENCOUNTER — Other Ambulatory Visit (HOSPITAL_COMMUNITY): Payer: Medicare HMO

## 2014-05-04 ENCOUNTER — Ambulatory Visit: Payer: Medicare HMO | Admitting: Surgery

## 2014-07-20 ENCOUNTER — Other Ambulatory Visit: Payer: Self-pay

## 2015-10-29 IMAGING — CT CT CTA ABD/PEL W/CM AND/OR W/O CM
1 of 7 series · 12 of 46 positions shown, 17 images · IV contrast (omnipaque)
Comparison: 03/20/2011

CLINICAL DATA: Evaluate abdominal aortic aneurysm.

EXAM:
CT ANGIOGRAPHY ABDOMEN AND PELVIS
TECHNIQUE: Multidetector CT imaging of the abdomen and pelvis was performed
using the standard protocol during bolus administration of
intravenous contrast. Multiplanar reconstructed images including
MIPs were obtained and reviewed to evaluate the vascular anatomy.
CONTRAST:  80 mL Omnipaque 350

[Series 4: angio (id) · axial · 0.68mm/px · z∈[-422,-8]mm · 12 of 192 slices shown, 17 images]
[im 13/192  soft-tissue]
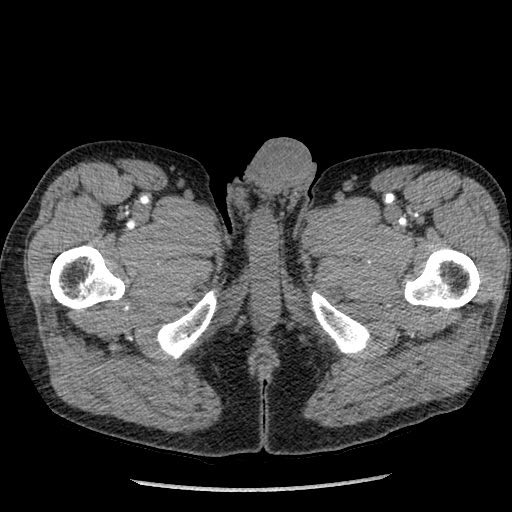
[im 13/192  bone]
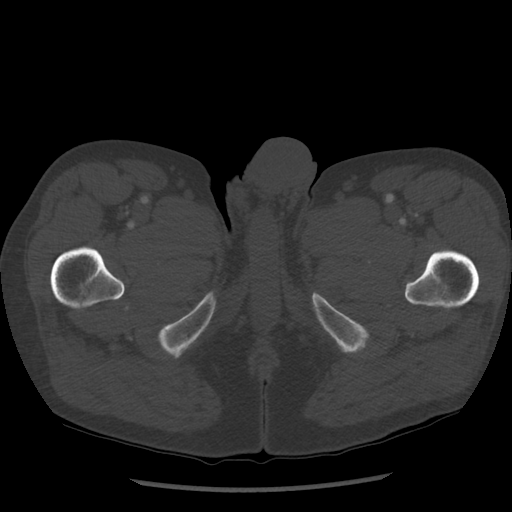
[im 26/192  soft-tissue]
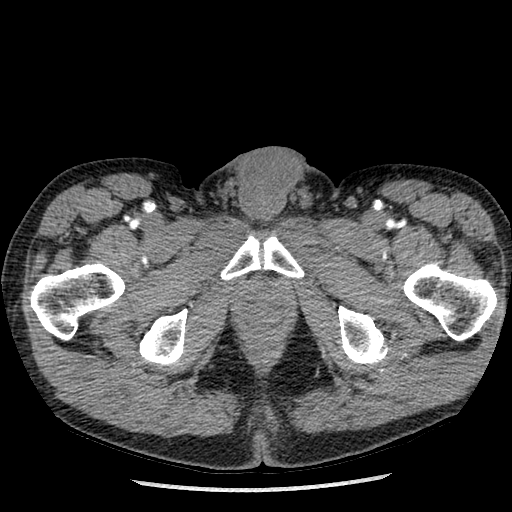
[im 51/192  soft-tissue]
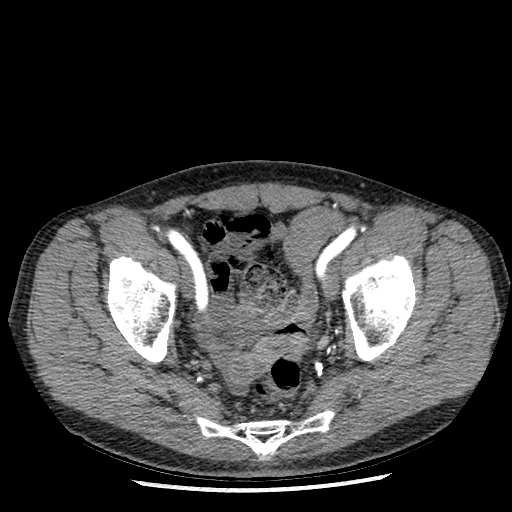
[im 64/192  soft-tissue]
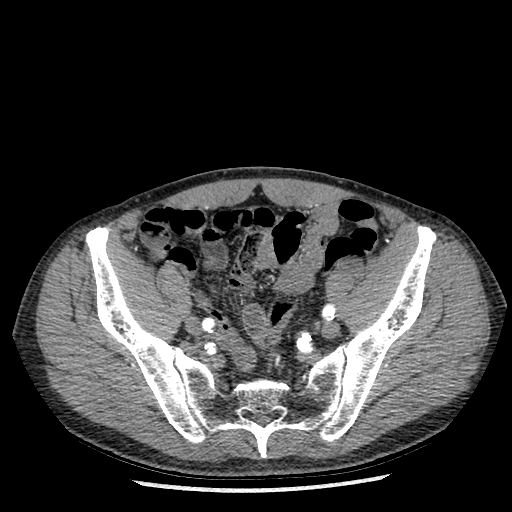
[im 77/192  soft-tissue]
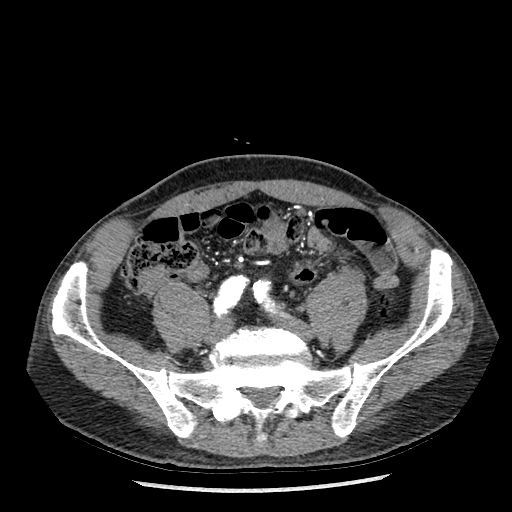
[im 102/192  soft-tissue]
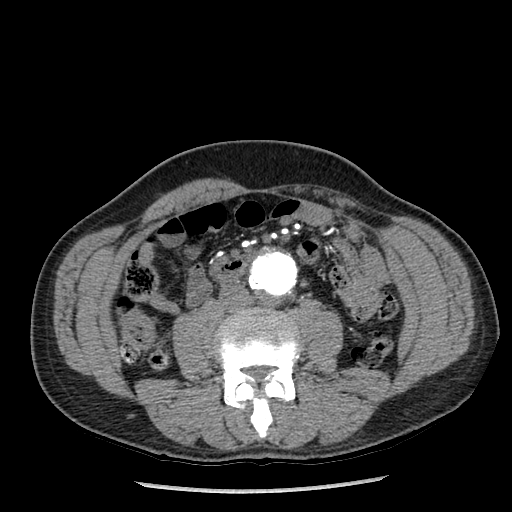
[im 115/192  soft-tissue]
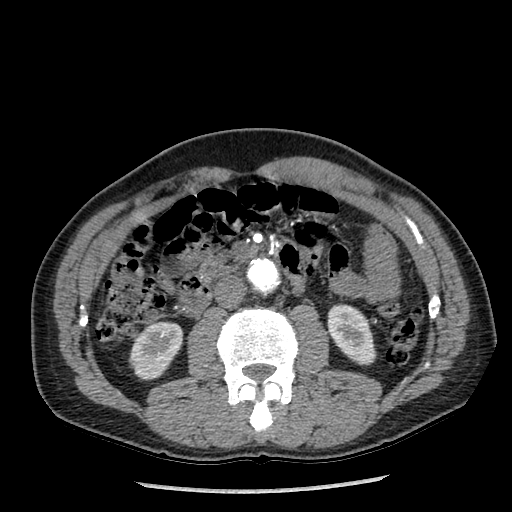
[im 128/192  soft-tissue]
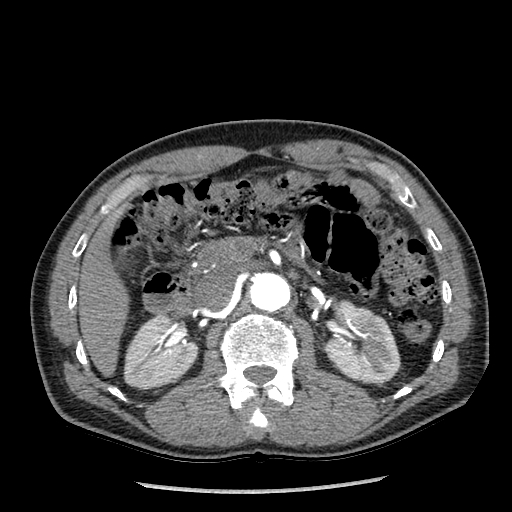
[im 141/192  soft-tissue]
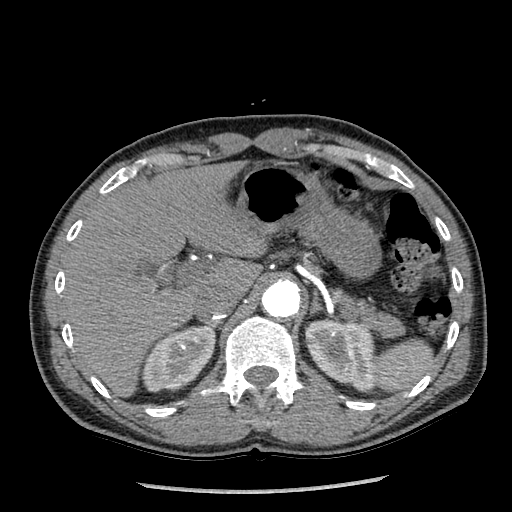
[im 141/192  lung]
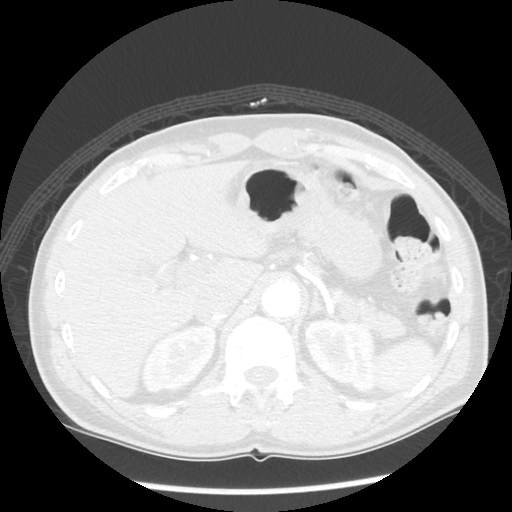
[im 141/192  bone]
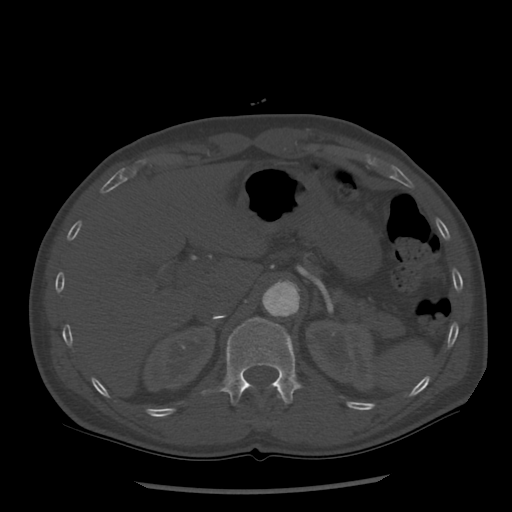
[im 153/192  lung]
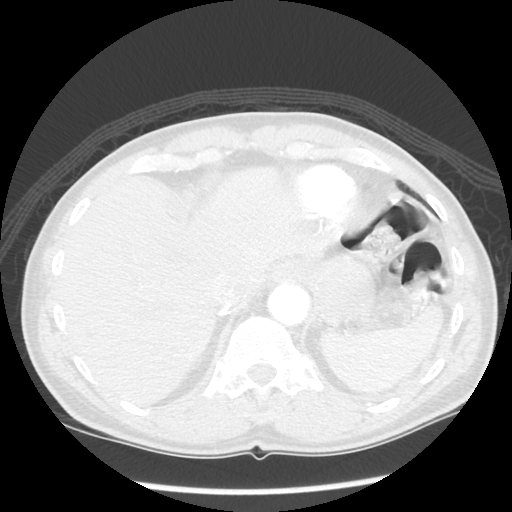
[im 166/192  soft-tissue]
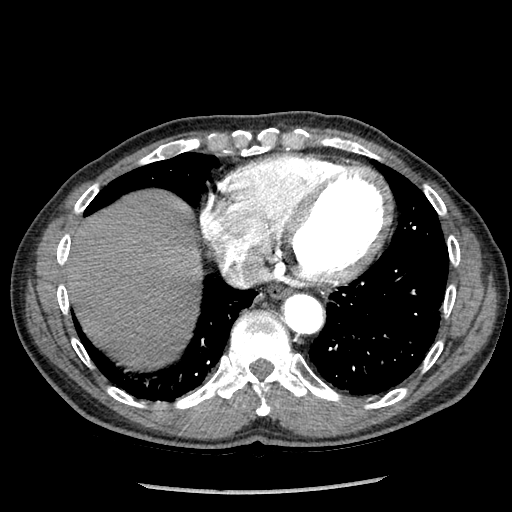
[im 166/192  lung]
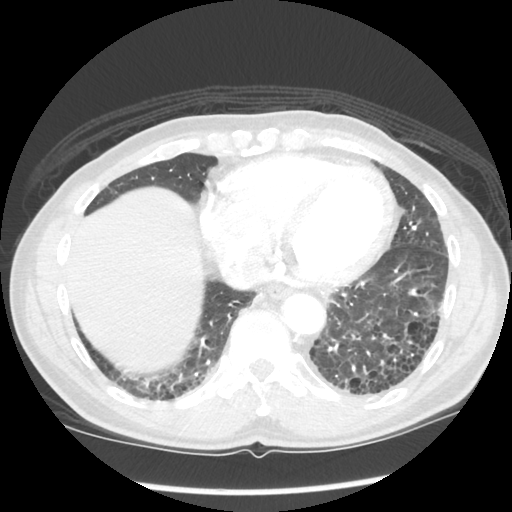
[im 179/192  soft-tissue]
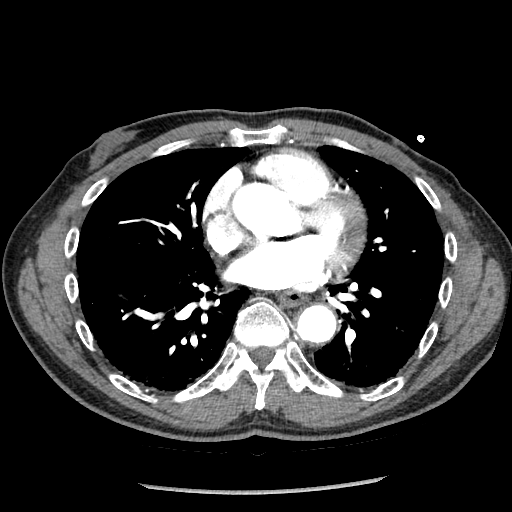
[im 179/192  lung]
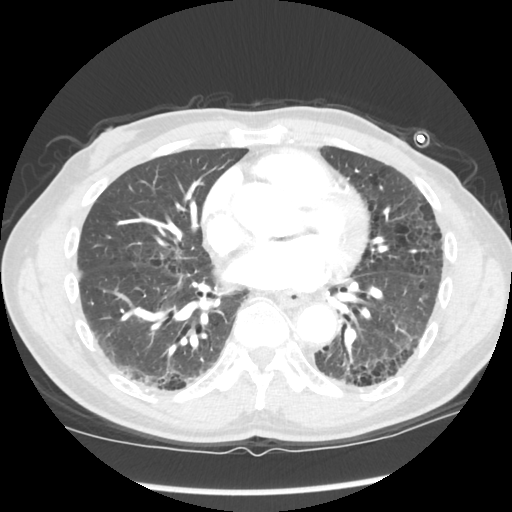

[12 of 46 positions shown; findings below may reference images not displayed]

FINDINGS: ARTERIAL FINDINGS:

Aorta: The aorta at the hiatus measures 2.9 cm and unchanged. The
abdominal aorta at the level of the renal arteries roughly measures
2.9 cm and unchanged. The distal abdominal aorta is aneurysmal with
peripheral mural thrombus. There has been interval enlargement of
the distal abdominal aortic aneurysm. Aneurysm now measures 4.9 cm
in the AP dimension and previously measured 4.5 cm. The aorta
measures 4.9 cm in the transverse dimension and previously measured
4.3 cm. Aneurysm terminates at the aortic bifurcation.

Celiac axis:         Celiac trunk and main branches are patent.

Superior mesenteric: Superior mesenteric artery is patent without
significant narrowing.

Left renal: Single left renal artery. Left renal artery is widely
patent.

Right renal: Single right renal artery with mixed plaque at the
origin of the right renal artery causing mild to moderate narrowing
at the origin.

Inferior mesenteric: Inferior mesenteric artery is patent.

Left iliac: Left common iliac artery measures up to 1.3 cm. Plaque
at the origin of the left internal iliac artery. The left internal
and external iliac arteries are patent. The left external iliac
artery measures 0.7 cm in smallest diameter. Proximal left femoral
arteries are patent.

Right iliac: The right common iliac artery measures up to 1.5 cm and
unchanged. Plaque at the right iliac bifurcation with stenosis at
the origin of the right internal iliac artery. Minimum diameter of
the right external iliac artery is roughly 0.9 cm. The proximal
right femoral arteries are patent.

Venous findings:     IVC and renal veins appear to be patent.

Review of the MIP images confirms the above findings.

NONVASCULAR FINDINGS:

Emphysematous changes at the lung bases. There is a 0.4 cm nodule in
the right middle lobe on sequence 5, image 9. This has not
significantly changed since 03/20/2011 and likely benign. Again
noted are low-density structures in the central aspect of the liver
which appear stable and probably represent hepatic cysts. Normal
appearance of the gallbladder, spleen, pancreas and adrenal glands.
Again noted are low-density structures scattered throughout both
kidneys which are suggestive for cysts. No gross abnormality to the
stomach or duodenum. There is no significant lymphadenopathy. Stable
appearance of the prostate with mild enlargement. No gross
abnormality to the urinary bladder. Difficult to exclude a trace
free fluid on the right side of the pelvis (sequence 4, image 143)
but this is similar to the previous examination and could be related
to bowel. No gross abnormality to the small or large bowel.
Degenerative changes in the lower lumbar spine. Again noted are
scattered areas of lucency in the pelvic bones that have not
significantly changed since 4305 and suspect this is related to
osteopenia. Disc space disease at L5-S1.
IMPRESSION: Interval enlargement of the distal abdominal aortic aneurysm. The
distal abdominal aortic aneurysm measures up to 4.9 cm and
previously measured 4.5 cm on 03/20/2011. The juxtarenal abdominal
aorta measures up to 2.9 cm.

Emphysematous changes with a stable 0.4 cm nodule in the right
middle lobe. This is likely a benign etiology and recommend
attention on follow-up imaging.

Probable hepatic and renal cysts.

## 2016-05-25 DIAGNOSIS — I10 Essential (primary) hypertension: Secondary | ICD-10-CM | POA: Diagnosis not present

## 2016-05-25 DIAGNOSIS — K5730 Diverticulosis of large intestine without perforation or abscess without bleeding: Secondary | ICD-10-CM | POA: Diagnosis not present

## 2016-05-25 DIAGNOSIS — J449 Chronic obstructive pulmonary disease, unspecified: Secondary | ICD-10-CM | POA: Diagnosis not present

## 2016-05-25 DIAGNOSIS — Z1211 Encounter for screening for malignant neoplasm of colon: Secondary | ICD-10-CM | POA: Diagnosis not present

## 2016-05-25 DIAGNOSIS — K635 Polyp of colon: Secondary | ICD-10-CM | POA: Diagnosis not present

## 2016-05-25 DIAGNOSIS — Z886 Allergy status to analgesic agent status: Secondary | ICD-10-CM | POA: Diagnosis not present

## 2016-05-25 DIAGNOSIS — N400 Enlarged prostate without lower urinary tract symptoms: Secondary | ICD-10-CM | POA: Diagnosis not present

## 2016-05-25 DIAGNOSIS — Z8673 Personal history of transient ischemic attack (TIA), and cerebral infarction without residual deficits: Secondary | ICD-10-CM | POA: Diagnosis not present

## 2016-05-25 DIAGNOSIS — K219 Gastro-esophageal reflux disease without esophagitis: Secondary | ICD-10-CM | POA: Diagnosis not present

## 2016-05-25 DIAGNOSIS — Z882 Allergy status to sulfonamides status: Secondary | ICD-10-CM | POA: Diagnosis not present

## 2017-01-09 DIAGNOSIS — N401 Enlarged prostate with lower urinary tract symptoms: Secondary | ICD-10-CM | POA: Diagnosis not present

## 2017-01-09 DIAGNOSIS — I10 Essential (primary) hypertension: Secondary | ICD-10-CM | POA: Diagnosis not present

## 2017-01-09 DIAGNOSIS — J441 Chronic obstructive pulmonary disease with (acute) exacerbation: Secondary | ICD-10-CM | POA: Diagnosis not present

## 2017-01-18 ENCOUNTER — Encounter: Payer: Self-pay | Admitting: *Deleted

## 2017-01-24 ENCOUNTER — Encounter: Payer: Self-pay | Admitting: Emergency Medicine

## 2017-02-02 ENCOUNTER — Telehealth: Payer: Self-pay | Admitting: Acute Care

## 2017-02-05 ENCOUNTER — Telehealth: Payer: Self-pay | Admitting: Pharmacy Technician

## 2017-02-06 NOTE — Telephone Encounter (Signed)
See telephone note 02/02/17.  Will close this message.

## 2017-02-06 NOTE — Telephone Encounter (Signed)
Spoke with pt and discussed lung cancer screening.  Due to pt having less than 30 pack year smoking history, he doesnt qualify for lung cancer screening.  Pt verbalized understanding.  Letter sent to Dr Luan Pulling to make him aware.

## 2017-02-16 ENCOUNTER — Institutional Professional Consult (permissible substitution): Payer: Medicare HMO | Admitting: Emergency Medicine

## 2017-04-17 DIAGNOSIS — E785 Hyperlipidemia, unspecified: Secondary | ICD-10-CM | POA: Diagnosis not present

## 2017-04-17 DIAGNOSIS — J449 Chronic obstructive pulmonary disease, unspecified: Secondary | ICD-10-CM | POA: Diagnosis not present

## 2017-04-17 DIAGNOSIS — N401 Enlarged prostate with lower urinary tract symptoms: Secondary | ICD-10-CM | POA: Diagnosis not present

## 2017-04-17 DIAGNOSIS — I10 Essential (primary) hypertension: Secondary | ICD-10-CM | POA: Diagnosis not present

## 2017-06-21 ENCOUNTER — Other Ambulatory Visit: Payer: Self-pay

## 2017-06-21 NOTE — Patient Outreach (Signed)
[facility]) Care Management  06/21/2017  [name] 1945/07/27 [id number]   Medication Adherence call to [name] patient's telephone number under [facility] belongs to someone else Epic has a new number [phone] left a message for patient to call back.patient is showing past due under [facility] Ins.on Simvastatin 20 mg.    [name] Management Direct Dial [phone]  Fax [phone] [name]

## 2017-07-21 DIAGNOSIS — N401 Enlarged prostate with lower urinary tract symptoms: Secondary | ICD-10-CM | POA: Diagnosis not present

## 2017-07-21 DIAGNOSIS — I10 Essential (primary) hypertension: Secondary | ICD-10-CM | POA: Diagnosis not present

## 2017-07-21 DIAGNOSIS — E785 Hyperlipidemia, unspecified: Secondary | ICD-10-CM | POA: Diagnosis not present

## 2017-07-21 DIAGNOSIS — Z125 Encounter for screening for malignant neoplasm of prostate: Secondary | ICD-10-CM | POA: Diagnosis not present

## 2017-07-21 DIAGNOSIS — J449 Chronic obstructive pulmonary disease, unspecified: Secondary | ICD-10-CM | POA: Diagnosis not present

## 2017-07-23 DIAGNOSIS — Z0000 Encounter for general adult medical examination without abnormal findings: Secondary | ICD-10-CM | POA: Diagnosis not present

## 2017-08-21 DIAGNOSIS — M5416 Radiculopathy, lumbar region: Secondary | ICD-10-CM | POA: Diagnosis not present

## 2017-10-29 DIAGNOSIS — Z23 Encounter for immunization: Secondary | ICD-10-CM | POA: Diagnosis not present

## 2017-10-29 DIAGNOSIS — M25649 Stiffness of unspecified hand, not elsewhere classified: Secondary | ICD-10-CM | POA: Diagnosis not present

## 2017-10-29 DIAGNOSIS — J441 Chronic obstructive pulmonary disease with (acute) exacerbation: Secondary | ICD-10-CM | POA: Diagnosis not present

## 2017-10-29 DIAGNOSIS — I10 Essential (primary) hypertension: Secondary | ICD-10-CM | POA: Diagnosis not present

## 2017-11-19 DIAGNOSIS — M19071 Primary osteoarthritis, right ankle and foot: Secondary | ICD-10-CM | POA: Diagnosis not present

## 2017-11-19 DIAGNOSIS — M549 Dorsalgia, unspecified: Secondary | ICD-10-CM | POA: Diagnosis not present

## 2017-11-19 DIAGNOSIS — M79673 Pain in unspecified foot: Secondary | ICD-10-CM | POA: Diagnosis not present

## 2017-11-19 DIAGNOSIS — M47816 Spondylosis without myelopathy or radiculopathy, lumbar region: Secondary | ICD-10-CM | POA: Diagnosis not present

## 2017-11-19 DIAGNOSIS — M79641 Pain in right hand: Secondary | ICD-10-CM | POA: Diagnosis not present

## 2017-11-19 DIAGNOSIS — M19042 Primary osteoarthritis, left hand: Secondary | ICD-10-CM | POA: Diagnosis not present

## 2017-11-19 DIAGNOSIS — M19072 Primary osteoarthritis, left ankle and foot: Secondary | ICD-10-CM | POA: Diagnosis not present

## 2017-11-19 DIAGNOSIS — M7989 Other specified soft tissue disorders: Secondary | ICD-10-CM | POA: Diagnosis not present

## 2017-11-19 DIAGNOSIS — M19041 Primary osteoarthritis, right hand: Secondary | ICD-10-CM | POA: Diagnosis not present

## 2017-11-19 DIAGNOSIS — M79642 Pain in left hand: Secondary | ICD-10-CM | POA: Diagnosis not present

## 2017-11-19 DIAGNOSIS — M79643 Pain in unspecified hand: Secondary | ICD-10-CM | POA: Diagnosis not present

## 2017-11-19 DIAGNOSIS — M0579 Rheumatoid arthritis with rheumatoid factor of multiple sites without organ or systems involvement: Secondary | ICD-10-CM | POA: Diagnosis not present

## 2017-11-28 DIAGNOSIS — J849 Interstitial pulmonary disease, unspecified: Secondary | ICD-10-CM | POA: Diagnosis not present

## 2017-11-28 DIAGNOSIS — J8410 Pulmonary fibrosis, unspecified: Secondary | ICD-10-CM | POA: Diagnosis not present

## 2017-12-05 DIAGNOSIS — R768 Other specified abnormal immunological findings in serum: Secondary | ICD-10-CM | POA: Diagnosis not present

## 2017-12-05 DIAGNOSIS — M0579 Rheumatoid arthritis with rheumatoid factor of multiple sites without organ or systems involvement: Secondary | ICD-10-CM | POA: Diagnosis not present

## 2017-12-19 DIAGNOSIS — M7989 Other specified soft tissue disorders: Secondary | ICD-10-CM | POA: Diagnosis not present

## 2017-12-19 DIAGNOSIS — M79643 Pain in unspecified hand: Secondary | ICD-10-CM | POA: Diagnosis not present

## 2017-12-19 DIAGNOSIS — M0579 Rheumatoid arthritis with rheumatoid factor of multiple sites without organ or systems involvement: Secondary | ICD-10-CM | POA: Diagnosis not present

## 2017-12-19 DIAGNOSIS — M79673 Pain in unspecified foot: Secondary | ICD-10-CM | POA: Diagnosis not present

## 2017-12-19 DIAGNOSIS — M549 Dorsalgia, unspecified: Secondary | ICD-10-CM | POA: Diagnosis not present

## 2018-01-07 ENCOUNTER — Telehealth: Payer: Self-pay | Admitting: Pulmonary Disease

## 2018-01-07 NOTE — Telephone Encounter (Signed)
Left detailed message requesting that pt arrive 87min prior to appt time on 01/07/18 to complete ILD questionnaire.  Nothing further is needed.

## 2018-01-08 ENCOUNTER — Encounter: Payer: Self-pay | Admitting: Pulmonary Disease

## 2018-01-08 ENCOUNTER — Ambulatory Visit (INDEPENDENT_AMBULATORY_CARE_PROVIDER_SITE_OTHER): Payer: Medicare Other | Admitting: Pulmonary Disease

## 2018-01-08 DIAGNOSIS — J849 Interstitial pulmonary disease, unspecified: Secondary | ICD-10-CM

## 2018-01-08 NOTE — Patient Instructions (Signed)
We will get records from Dr. Kathlene November, rheumatology regarding his assessment of your rheumatoid arthritis For evaluation of interstitial lung disease and COPD we will schedule you for high-resolution CT and pulmonary function test Return to clinic in 2 to 4 weeks for review.

## 2018-01-08 NOTE — Progress Notes (Signed)
[name]    [id number]    02/05/1945  Primary Care Physician:Bluth, Selinda Flavin, MD  Referring Physician: Lahoma Rocker, MD [address] [place], [place] [address]  Chief complaint: Consult for ILD  HPI: 72 year old with history of COPD, rheumatoid arthritis, hepatitis, hyperlipidemia Recently diagnosed with rheumatoid arthritis.  Follows with Dr Kathlene November, Rheumatology. Sent here for evaluation of abnormal chest x-ray.  Reports chronic dyspnea on exertion.  No symptoms at rest.  No cough, sputum production, wheezing.  No fevers, chills.  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  No mold, hot tub, Jacuzzi, humidifier Smoking history: 12.5-pack-year history.  Quit in 2009 Travel history: Previously lived in [place].  No significant recent travel Relevant family history: No significant family history of lung disease.  Outpatient Encounter Medications as of 01/08/2018  Medication Sig  . amLODipine (NORVASC) 5 MG tablet Take 5 mg by mouth daily.  . celecoxib (CELEBREX) 200 MG capsule Take 200 mg by mouth daily.  . Cholecalciferol (VITAMIN D3) 10 MCG (400 UNIT) tablet Take 400 Units by mouth daily.  . famotidine (PEPCID) 10 MG tablet Take 10 mg by mouth daily as needed for heartburn or indigestion.  . Fluticasone-Umeclidin-Vilant (TRELEGY ELLIPTA) 100-62.5-25 MCG/INH AEPB Inhale 1 puff into the lungs daily.  . hydroxychloroquine (PLAQUENIL) 200 MG tablet Take 400 mg by mouth daily.  Marland Kitchen ipratropium-albuterol (DUONEB) 0.5-2.5 (3) MG/3ML SOLN Take 3 mLs by nebulization 4 (four) times daily.   Marland Kitchen PROAIR HFA 108 (90 BASE) MCG/ACT inhaler Inhale 2 puffs into the lungs 4 (four) times daily as needed for wheezing or shortness of breath.   . simvastatin (ZOCOR) 20 MG tablet Take 20 mg by mouth at bedtime.   . tamsulosin (FLOMAX) 0.4 MG CAPS capsule Take 0.4 mg by mouth  daily.  . [DISCONTINUED] doxazosin (CARDURA) 4 MG tablet Take 4 mg by mouth at bedtime.  . [DISCONTINUED] Fluticasone-Salmeterol (ADVAIR) 500-50 MCG/DOSE AEPB Inhale 1 puff into the lungs 2 (two) times daily.  . [DISCONTINUED] gabapentin (NEURONTIN) 300 MG capsule Take 300 mg by mouth 3 (three) times daily as needed (pain).   . [DISCONTINUED] loratadine (CLARITIN) 10 MG tablet Take 10 mg by mouth daily as needed for allergies.   . [DISCONTINUED] oxyCODONE (ROXICODONE) 5 MG immediate release tablet Take 1 tablet (5 mg total) by mouth every 6 (six) hours as needed for severe pain. (Patient not taking: Reported on 01/08/2018)  . [DISCONTINUED] ranitidine (ZANTAC) 150 MG tablet Take 150 mg by mouth every morning. Equate brand   No facility-administered encounter medications on file as of 01/08/2018.     Allergies as of 01/08/2018 - Review Complete 01/08/2018  Allergen Reaction Noted  . Sulfa antibiotics Itching and Rash 02/22/2011  . Ibuprofen  01/08/2018    Past Medical History:  Diagnosis Date  . AAA (abdominal aortic aneurysm) ([place])   . AAA (abdominal aortic aneurysm) ([place])   . Anxiety   . Arthritis   . Asthma   . Chronic back pain    buldging disc  . COPD (chronic obstructive pulmonary disease) ([place])    Duoneb daily as well as Advair  . Depression    after accident over 51yrs ago but not on any meds  . Emphysema lung ([place])   . GERD (gastroesophageal reflux disease)    takes Zantac daily  . Hepatitis    Hx of  Hep A  . History of bronchitis 2008  . History of colon polyps   . History of gastric ulcer   . History of shingles   . Hyperlipidemia    takes Zocor daily  . Hypertension    takes Cardura daily  . Joint pain   . Neuropathy    takes Gabapentin daily as needed  . Peripheral vascular disease ([place])   . Pneumonia 2008  . Seizures ([place])    in 1992 only had one;was placed on meds but has been off over 62yrs ago  . Shortness of breath    with exertion    Past  Surgical History:  Procedure Laterality Date  . ABDOMINAL AORTIC ENDOVASCULAR STENT GRAFT N/A 10/02/2013   Procedure: ABDOMINAL AORTIC ENDOVASCULAR STENT GRAFT;  Surgeon: Serafina Mitchell, MD;  Location: [facility];  Service: Vascular;  Laterality: N/A;  . ANKLE SURGERY Right   . CATARACT EXTRACTION W/PHACO Right 05/06/2012   Procedure: CATARACT EXTRACTION PHACO AND INTRAOCULAR LENS PLACEMENT (IOC);  Surgeon: Tonny Branch, MD;  Location: [facility];  Service: Ophthalmology;  Laterality: Right;  CDE:13.61  . CATARACT EXTRACTION W/PHACO Left 05/20/2012   Procedure: CATARACT EXTRACTION PHACO AND INTRAOCULAR LENS PLACEMENT (IOC);  Surgeon: Tonny Branch, MD;  Location: [facility];  Service: Ophthalmology;  Laterality: Left;  CDE: 12.32  . COLONOSCOPY    . ROTATOR CUFF REPAIR  1992   Right shoulder    Family History  Problem Relation Age of Onset  . Hypertension Mother   . Hyperlipidemia Mother   . Cancer Mother   . Heart disease Father   . Heart attack Father   . Hyperlipidemia Father   . Hypertension Father   . Diabetes Father   . Diabetes Sister   . Cancer Sister   . Hyperlipidemia Sister   . Hypertension Sister   . Asthma Sister   . Cancer Brother   . Hyperlipidemia Brother   . Hypertension Brother     Social History   Socioeconomic History  . Marital status: Married    Spouse name: Not on file  . Number of children: Not on file  . Years of education: Not on file  . Highest education level: Not on file  Occupational History  . Not on file  Social Needs  . Financial resource strain: Not on file  . Food insecurity:    Worry: Not on file    Inability: Not on file  . Transportation needs:    Medical: Not on file    Non-medical: Not on file  Tobacco Use  . Smoking status: Former Smoker    Packs/day: 0.25    Years: 50.00    Pack years: 12.50    Types: Cigarettes    Last attempt to quit: 01/24/2007    Years since quitting: 10.9  . Smokeless tobacco: Never Used  . Tobacco comment: average  3-4 cigs per week   Substance and Sexual Activity  . Alcohol use: No  . Drug use: Yes    Types: Marijuana    Comment: last time 21yrs ago crack  . Sexual activity: Yes    Birth control/protection: None  Lifestyle  . Physical activity:    Days per week: Not on file    Minutes per session: Not on file  . Stress: Not on file  Relationships  . Social connections:    Talks on phone: Not on file    Gets together: Not on file    Attends religious service: Not on file  Active member of club or organization: Not on file    Attends meetings of clubs or organizations: Not on file    Relationship status: Not on file  . Intimate partner violence:    Fear of current or ex partner: Not on file    Emotionally abused: Not on file    Physically abused: Not on file    Forced sexual activity: Not on file  Other Topics Concern  . Not on file  Social History Narrative  . Not on file    Review of systems: Review of Systems  Constitutional: Negative for fever and chills.  HENT: Negative.   Eyes: Negative for blurred vision.  Respiratory: as per HPI  Cardiovascular: Negative for chest pain and palpitations.  Gastrointestinal: Negative for vomiting, diarrhea, blood per rectum. Genitourinary: Negative for dysuria, urgency, frequency and hematuria.  Musculoskeletal: Negative for myalgias, back pain and joint pain.  Skin: Negative for itching and rash.  Neurological: Negative for dizziness, tremors, focal weakness, seizures and loss of consciousness.  Endo/Heme/Allergies: Negative for environmental allergies.  Psychiatric/Behavioral: Negative for depression, suicidal ideas and hallucinations.  All other systems reviewed and are negative.  Physical Exam: Blood pressure 116/74, pulse 71, height 5\' 11"  (1.803 m), weight 155 lb (70.3 kg), SpO2 95 %. Gen:      No acute distress HEENT:  EOMI, sclera anicteric Neck:     No masses; no thyromegaly Lungs:    Clear to auscultation bilaterally; normal  respiratory effort CV:         Regular rate and rhythm; no murmurs Abd:      + bowel sounds; soft, non-tender; no palpable masses, no distension Ext:    No edema; adequate peripheral perfusion Skin:      Warm and dry; no rash Neuro: alert and oriented x 3 Psych: normal mood and affect  Data Reviewed: Imaging: CT abdomen pelvis 10/27/2013- visualized lung bases show peripheral honeycombing with mild fibrosis, reticulation, architectural distortion.  I have reviewed the images personally.  Assessment:  Interstitial lung disease Prior imaging from 2015 noted with honeycombing and basilar fibrosis.  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 Scheduled for high-resolution CT and pulmonary function test  Get records from rheumatology office.  Plan/Recommendations: - High-resolution CT, PFTs.  Marshell Garfinkel MD [facility] 01/08/2018, 2:50 PM  CC: Lahoma Rocker, MD

## 2018-01-15 ENCOUNTER — Ambulatory Visit (HOSPITAL_COMMUNITY)
Admission: RE | Admit: 2018-01-15 | Discharge: 2018-01-15 | Disposition: A | Discharge status: Home / Self Care | Payer: Medicare Other | Source: Ambulatory Visit | Attending: Pulmonary Disease | Admitting: Pulmonary Disease

## 2018-01-15 DIAGNOSIS — J849 Interstitial pulmonary disease, unspecified: Secondary | ICD-10-CM | POA: Diagnosis not present

## 2018-01-15 DIAGNOSIS — J432 Centrilobular emphysema: Secondary | ICD-10-CM | POA: Diagnosis not present

## 2018-01-29 DIAGNOSIS — J441 Chronic obstructive pulmonary disease with (acute) exacerbation: Secondary | ICD-10-CM | POA: Diagnosis not present

## 2018-01-29 DIAGNOSIS — M069 Rheumatoid arthritis, unspecified: Secondary | ICD-10-CM | POA: Diagnosis not present

## 2018-01-29 DIAGNOSIS — I10 Essential (primary) hypertension: Secondary | ICD-10-CM | POA: Diagnosis not present

## 2018-02-13 ENCOUNTER — Other Ambulatory Visit: Payer: Self-pay | Admitting: Nurse Practitioner

## 2018-02-13 DIAGNOSIS — B182 Chronic viral hepatitis C: Secondary | ICD-10-CM

## 2018-02-15 ENCOUNTER — Encounter: Payer: Self-pay | Admitting: Pulmonary Disease

## 2018-02-15 ENCOUNTER — Ambulatory Visit: Payer: Medicare Other | Admitting: Pulmonary Disease

## 2018-02-15 ENCOUNTER — Ambulatory Visit (INDEPENDENT_AMBULATORY_CARE_PROVIDER_SITE_OTHER): Payer: Medicare Other | Admitting: Pulmonary Disease

## 2018-02-15 ENCOUNTER — Telehealth: Payer: Self-pay | Admitting: Pulmonary Disease

## 2018-02-15 VITALS — BP 118/70 | HR 75 | Ht 70.0 in | Wt 151.0 lb

## 2018-02-15 DIAGNOSIS — R0602 Shortness of breath: Secondary | ICD-10-CM

## 2018-02-15 DIAGNOSIS — J849 Interstitial pulmonary disease, unspecified: Secondary | ICD-10-CM

## 2018-02-15 DIAGNOSIS — R911 Solitary pulmonary nodule: Secondary | ICD-10-CM

## 2018-02-15 LAB — PULMONARY FUNCTION TEST
DL/VA % PRED: 61 %
DL/VA: 2.81 ml/min/mmHg/L
DLCO unc % pred: 23 %
DLCO unc: 7.58 ml/min/mmHg
FEF 25-75 POST: 1.27 L/s
FEF 25-75 Pre: 1.16 L/sec
FEF2575-%Change-Post: 9 %
FEF2575-%Pred-Post: 53 %
FEF2575-%Pred-Pre: 49 %
FEV1-%Change-Post: 2 %
FEV1-%PRED-POST: 52 %
FEV1-%Pred-Pre: 51 %
FEV1-POST: 1.49 L
FEV1-Pre: 1.46 L
FEV1FVC-%Change-Post: 8 %
FEV1FVC-%PRED-PRE: 102 %
FEV6-%Change-Post: -7 %
FEV6-%Pred-Post: 48 %
FEV6-%Pred-Pre: 52 %
FEV6-POST: 1.74 L
FEV6-Pre: 1.87 L
FEV6FVC-%Change-Post: 0 %
FEV6FVC-%PRED-POST: 104 %
FEV6FVC-%Pred-Pre: 105 %
FVC-%Change-Post: -5 %
FVC-%PRED-POST: 46 %
FVC-%PRED-PRE: 49 %
FVC-POST: 1.77 L
FVC-PRE: 1.87 L
Post FEV1/FVC ratio: 84 %
Post FEV6/FVC ratio: 100 %
Pre FEV1/FVC ratio: 78 %
Pre FEV6/FVC Ratio: 100 %
RV % pred: 42 %
RV: 1.08 L
TLC % PRED: 44 %
TLC: 3.11 L

## 2018-02-15 LAB — NITRIC OXIDE: Nitric Oxide: 19

## 2018-02-15 NOTE — Patient Instructions (Signed)
We get some blood test today for evaluation of interstitial lung disease We will check alpha-1 antitrypsin levels and phenotype We will schedule you for a 6-minute walk test Follow-up CT without contrast in 3 months  Follow-up in clinic in 3 months.

## 2018-02-15 NOTE — Telephone Encounter (Addendum)
Per Dr. Vaughan Browner verbally- after discussion with Dr. Kathlene November, he would like to start process for ofev.  I have left a message to make pt aware of this information.  There is a form that pt will need to sign. Can he come by our office to do so? Forms have been placed in my to do folder.

## 2018-02-15 NOTE — Progress Notes (Signed)
PFT done today. 

## 2018-02-15 NOTE — Progress Notes (Signed)
[name]    [id number]    [birth year]  Primary Care Physician:Hawkins, Percell Miller, MD  Referring Physician: Celedonio Savage, MD [address] [place], [place] [address]  Chief complaint: Follow up for RA-ILD  HPI: 73 year old with history of COPD, rheumatoid arthritis, hepatitis, hyperlipidemia Recently diagnosed with rheumatoid arthritis.  Follows with Dr Kathlene November, Rheumatology. Sent here for evaluation of abnormal chest x-ray.  Reports chronic dyspnea on exertion.  No symptoms at rest.  No cough, sputum production, wheezing.  No fevers, chills.  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  No mold, hot tub, Jacuzzi, humidifier Smoking history: 12.5-pack-year history.  Quit in 2009 Travel history: Previously lived in [place].  No significant recent travel Relevant family history: No significant family history of lung disease.  Interim history: Patient is here for review of his pulmonary function test and high-resolution CT scan States that dyspnea on exertion is stable at baseline.  He gets winded with minimal exertion.  He has nonproductive cough.  No wheezing, chills  Outpatient Encounter Medications as of 02/15/2018  Medication Sig  . amLODipine (NORVASC) 5 MG tablet Take 5 mg by mouth daily.  . celecoxib (CELEBREX) 200 MG capsule Take 200 mg by mouth daily.  . Cholecalciferol (VITAMIN D3) 10 MCG (400 UNIT) tablet Take 400 Units by mouth daily.  . famotidine (PEPCID) 10 MG tablet Take 10 mg by mouth daily as needed for heartburn or indigestion.  . Fluticasone-Umeclidin-Vilant (TRELEGY ELLIPTA) 100-62.5-25 MCG/INH AEPB Inhale 1 puff into the lungs daily.  . hydroxychloroquine (PLAQUENIL) 200 MG tablet Take 400 mg by mouth daily.  Marland Kitchen ipratropium-albuterol (DUONEB) 0.5-2.5 (3) MG/3ML SOLN Take 3 mLs by nebulization 4 (four) times daily.   Marland Kitchen PROAIR HFA 108 (90 BASE) MCG/ACT  inhaler Inhale 2 puffs into the lungs 4 (four) times daily as needed for wheezing or shortness of breath.   . simvastatin (ZOCOR) 20 MG tablet Take 20 mg by mouth at bedtime.   . tamsulosin (FLOMAX) 0.4 MG CAPS capsule Take 0.4 mg by mouth daily.   No facility-administered encounter medications on file as of 02/15/2018.    Physical Exam: Blood pressure 116/74, pulse 71, height 5\' 11"  (1.803 m), weight 155 lb (70.3 kg), SpO2 95 %. Gen:      No acute distress HEENT:  EOMI, sclera anicteric Neck:     No masses; no thyromegaly Lungs:    Clear to auscultation bilaterally; normal respiratory effort CV:         Regular rate and rhythm; no murmurs Abd:      + bowel sounds; soft, non-tender; no palpable masses, no distension Ext:    No edema; adequate peripheral perfusion Skin:      Warm and dry; no rash Neuro: alert and oriented x 3 Psych: normal mood and affect  Data Reviewed: Imaging: CT abdomen pelvis 10/27/2013- visualized lung bases show peripheral honeycombing with mild fibrosis, reticulation, architectural distortion.    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 Bullous emphysematous changes, new nodular lesion in the left upper lobe.  Enlarged pulmonary artery. I have reviewed the images personally.  PFTs 02/15/2018 FVC 1.77 (46%), FEV1 1.49 (52%), F/F 84, TLC 44%, DLCO 33% Severe restriction, diffusion impairment.  FENO 02/15/2018- 19  Assessment:  RA-ILD Imaging shows progressive honeycombing, basilar fibrosis and UIP pattern.  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 Send serologies for work-up of chronic CTD ILD  Discussed with Dr. Kathlene November, his rheumatology about management of rheumatoid arthritis.  Patient is currently getting evaluated for hepatitis C infection and cannot get stronger immunosuppressives until HCV has been cured.  He is just on Plaquenil Given progression  of his fibrotic interstitial lung disease we will start anti-fibrotic therapy with Ofev.  Emphysema No significant obstruction on pulmonary function test Continue Trelegy inhaler Did not desat on exertion.  Schedule 6MW test.   Emphysematous changes appear to be out of proportion to his smoking history.  Will check alpha-1 antitrypsin levels and phenotype.  Left upper lobe lung nodule Follow-up CT in 3 months.  Enlarged pulmonary artery on CT Check echocardiogram for evaluation of pulmonary hypertension.  More then 1/2 the time of the 40 min visit was spent in counseling and/or coordination of care with the patient and family.  Plan/Recommendations: - Continue Trelegy inhaler, 6-minute walk test - ILD serologies, alpha-1 antitrypsin levels and phenotype, CBC differential, IgE - Start Ofev - Echocardiogram - Follow-up CT in 3 months  Marshell Garfinkel MD [facility] 02/15/2018, 10:27 AM  CC: Celedonio Savage, MD

## 2018-02-18 DIAGNOSIS — M79673 Pain in unspecified foot: Secondary | ICD-10-CM | POA: Diagnosis not present

## 2018-02-18 DIAGNOSIS — M0579 Rheumatoid arthritis with rheumatoid factor of multiple sites without organ or systems involvement: Secondary | ICD-10-CM | POA: Diagnosis not present

## 2018-02-18 DIAGNOSIS — M549 Dorsalgia, unspecified: Secondary | ICD-10-CM | POA: Diagnosis not present

## 2018-02-18 DIAGNOSIS — M7989 Other specified soft tissue disorders: Secondary | ICD-10-CM | POA: Diagnosis not present

## 2018-02-18 DIAGNOSIS — M79643 Pain in unspecified hand: Secondary | ICD-10-CM | POA: Diagnosis not present

## 2018-02-19 NOTE — Telephone Encounter (Signed)
Lmtcb x2 for pt

## 2018-02-20 ENCOUNTER — Ambulatory Visit
Admission: RE | Admit: 2018-02-20 | Discharge: 2018-02-20 | Disposition: A | Discharge status: Home / Self Care | Payer: Medicare Other | Source: Ambulatory Visit | Attending: Nurse Practitioner | Admitting: Nurse Practitioner

## 2018-02-20 DIAGNOSIS — N281 Cyst of kidney, acquired: Secondary | ICD-10-CM | POA: Diagnosis not present

## 2018-02-20 DIAGNOSIS — B182 Chronic viral hepatitis C: Secondary | ICD-10-CM

## 2018-02-20 DIAGNOSIS — K7689 Other specified diseases of liver: Secondary | ICD-10-CM | POA: Diagnosis not present

## 2018-02-22 NOTE — Telephone Encounter (Signed)
Attempted to call pt but unable to reach him. Left message for pt to return call. 

## 2018-02-24 LAB — ANA,IFA RA DIAG PNL W/RFLX TIT/PATN
Anti Nuclear Antibody(ANA): NEGATIVE
Cyclic Citrullin Peptide Ab: 108 UNITS — ABNORMAL HIGH
Rhuematoid fact SerPl-aCnc: 54 IU/mL — ABNORMAL HIGH (ref <14)

## 2018-02-24 LAB — ANCA SCREEN W REFLEX TITER
ANCA SCREEN: POSITIVE — AB
C-ANCA Titer: 1:80 {titer} — ABNORMAL HIGH

## 2018-02-24 LAB — ALPHA-1 ANTITRYPSIN PHENOTYPE: A-1 Antitrypsin, Ser: 161 mg/dL (ref 83–199)

## 2018-02-24 LAB — SJOGREN'S SYNDROME ANTIBODS(SSA + SSB)
SSA (Ro) (ENA) Antibody, IgG: <1 AI
SSB (La) (ENA) Antibody, IgG: <1 AI

## 2018-02-24 LAB — ANTI-SCLERODERMA ANTIBODY: Scleroderma (Scl-70) (ENA) Antibody, IgG: <1 AI

## 2018-02-28 LAB — MYOSITIS PANEL III: RNP: <1 EU/ml

## 2018-02-28 LAB — HYPERSENSITIVITY PNEUMONITIS
A. FUMIGATUS #1 ABS: NEGATIVE
A. Pullulans Abs: NEGATIVE
Micropolyspora faeni, IgG: NEGATIVE
Pigeon Serum Abs: NEGATIVE
THERMOACT. SACCHARII: NEGATIVE
Thermoactinomyces vulgaris, IgG: NEGATIVE

## 2018-03-04 NOTE — Telephone Encounter (Signed)
lmtcb for pt.  

## 2018-03-11 NOTE — Telephone Encounter (Signed)
Lm for pt

## 2018-03-15 NOTE — Telephone Encounter (Signed)
Lm for pt.  Letter has been mailed to address on file.

## 2018-03-19 ENCOUNTER — Telehealth: Payer: Self-pay | Admitting: Pulmonary Disease

## 2018-03-19 NOTE — Telephone Encounter (Signed)
Blankenship, Margie A, CMA       5:23 PM  Note    Per Dr. Vaughan Browner verbally- after discussion with Dr. Kathlene November, he would like to start process for ofev.  I have left a message to make pt aware of this information.  There is a form that pt will need to sign. Can he come by our office to do so? Forms have been placed in my to do folder.      Spoke with patient in regards to message above. He has agreed to start Ofev. Advised him of the paperwork that was needed to be signed. He stated that he will be here tomorrow to sign them.   Will get the papers from [place].   Nothing further needed at time of call.

## 2018-03-25 DIAGNOSIS — K740 Hepatic fibrosis: Secondary | ICD-10-CM | POA: Diagnosis not present

## 2018-03-28 ENCOUNTER — Telehealth: Payer: Self-pay | Admitting: Pulmonary Disease

## 2018-03-28 NOTE — Telephone Encounter (Signed)
Spoke with wife [name] and she states [facility] needs his diagnosis before they can help. We ned to calla [phone]. I called them and they stated that the HIPPA form was not signed so she could not talk to me about the patient. She advised me to call the pt and advise them what his diagnosis is. I called his wife back and provided the diagnosis code and diagnosis. She understood and advised her to call them to let them know. Nothing further is needed.

## 2018-03-29 ENCOUNTER — Telehealth: Payer: Self-pay | Admitting: Pulmonary Disease

## 2018-03-29 NOTE — Telephone Encounter (Signed)
Spoke with the [name]  He is asking if Dr Oneita Jolly, thinks that the asbestos exposure caused his ILD  He is supposed to be starting on med called epclusa and wants to know if this will have any adverse interaction with his Ofev  Please advise thanks

## 2018-03-31 NOTE — Telephone Encounter (Signed)
There is no known interaction between these 2 medication Asbestos exposure can cause ILD but since he has worsening lung fibrosis we have decided to start him on therapy. Patient will need his hepatic panel checked.  Can you have him come in for labs.

## 2018-04-01 NOTE — Telephone Encounter (Signed)
Attempted to call pt but unable to reach. Left message for pt to return call. 

## 2018-04-02 ENCOUNTER — Telehealth: Payer: Self-pay | Admitting: Pulmonary Disease

## 2018-04-02 NOTE — Telephone Encounter (Signed)
Called and spoke with Nira Conn from [facility] cares. They were needing the diagnosis code for the patient. Advised that this was J84.9 ILD. Nira Conn stated that nothing was further needed. Will close encounter.

## 2018-04-02 NOTE — Telephone Encounter (Signed)
Called and spoke with patient, he stated that when he talked with [facility] this morning they told him that he was not in the system at all. [place] and spoke with Candace. She stated that the patient is not in the system and she does not see where his information was sent in. When looking in the patients chart I see where the patient was supposed to come in on 2/26 to sign the papers. Called and asked patient if he was able to come in and sign those papers that were needed for him to get the medication sent to the pharmacy, patient stated that he never signed any forms.   Margie please advise if you have anything on this patient, thank you.

## 2018-04-03 NOTE — Telephone Encounter (Deleted)
i'm unsure if forms have been signed, as I'm currently in the [place] office. Forms can  Be located in my green follow up folder if they have not been signed.

## 2018-04-03 NOTE — Telephone Encounter (Signed)
I checked the green folder and did not see any forms in there of pt. Margie, do you think it is just the open doors form that we are needing or is it the open doors patient assistance as well as the enrollment form that is needed? I can redo the open doors form and call pt to see if he could come by the office to sign it.

## 2018-04-03 NOTE — Telephone Encounter (Signed)
i'm unsure if forms have been signed, as I am currently working in the [employer] office. Forms can be located in my green follow up folder if they have not been signed.  Thanks.

## 2018-04-03 NOTE — Telephone Encounter (Signed)
Pt came by and signed the forms for open doors. Forms have been faxed in for pt. Nothing further needed.

## 2018-04-03 NOTE — Telephone Encounter (Signed)
Went up front and pt's paperwork is still in the file cabinet up front with a note on there for pt to sign in highlighted areas.  Called and spoke with pt and wife [name] letting them know this and asked if they could come by office to sign forms so we could get things taken care of for the [place]. Stated to pt to just tell front staff that they are here for forms, which are located in the file folder and they could get the forms for pt to sign.  Pt and [name] expressed understanding.  Will leave encounter open until all has been taken care of.

## 2018-04-03 NOTE — Telephone Encounter (Signed)
Please see 03/19/18 phone note.  I'm unsure as to what is needed. If patient does not recall coming by our office and singing forms, forms may be up front.

## 2018-04-05 ENCOUNTER — Telehealth: Payer: Self-pay | Admitting: Pulmonary Disease

## 2018-04-05 NOTE — Telephone Encounter (Signed)
Spoke with the pt's spouse She is asking for information about what caused pt's ILD  She specifically wants to know if Dr Vaughan Browner thinks that asbestos caused his problem  Please advise thanks

## 2018-04-05 NOTE — Telephone Encounter (Signed)
His ILD is likely caused by the rheumatoid arthritis. Asbestos exposure can cause ILD but typically with other findings like thickening of lung lining that we do not see here.

## 2018-04-05 NOTE — Telephone Encounter (Signed)
Spoke with the pt and his spouse and notified of response per Dr Vaughan Browner  They verbalized understanding  Pt wanted his last ov faxed to his lawyer attn ester at [phone] I have faxed this over  Nothing further needed

## 2018-04-08 ENCOUNTER — Telehealth: Payer: Self-pay | Admitting: Pulmonary Disease

## 2018-04-08 NOTE — Telephone Encounter (Signed)
lmtcb x1 pt 

## 2018-04-09 NOTE — Telephone Encounter (Signed)
Pt is returning call. Cb is [phone]

## 2018-04-09 NOTE — Telephone Encounter (Signed)
I called the pt but he was not available. His wife stated that they were not contacted yet. I called [employer] and they stated it takes 24 hours for the application to upload in their system. She said to call back tomorrow for an update.

## 2018-04-09 NOTE — Telephone Encounter (Signed)
Patient is returning phone call.  Patient phone number is [phone].

## 2018-04-09 NOTE — Telephone Encounter (Signed)
Called Ofev at [phone] to check on status of medication. Per his chart, the application was faxed over on 04/03/2018.   Had to leave a message for [employer] to call back.   Left detailed message for patient.

## 2018-04-09 NOTE — Telephone Encounter (Signed)
Please see 04/02/18 phone note. Thanks!

## 2018-04-09 NOTE — Telephone Encounter (Signed)
Called and spoke with patient regarding OFEV medication Advised that we have LVM to them regarding in need of an update  Called and spoke [place] [employer] needing up date on status of Ofev at [phone]. She advised that we need to call [employer] at phone [phone] Called and spoke with Darrick Penna with [place] regarding OFEV application status Darrick Penna states the fax did not go through, they do not have the OFEV application at this time Checked Margie's box/folder and do not see OFEV application on the patient [employer] doesn't have it, and they are requesting it to be refaxed to [phone] for review.  Margie please advise. Thank you.

## 2018-04-10 NOTE — Telephone Encounter (Signed)
Called [employer]. Was on hold for over 10 minutes. Will call back later after lunch.

## 2018-04-11 NOTE — Telephone Encounter (Signed)
Called [employer] for Ofev status update. Spoke with Lenna Sciara, [employer] [facility].  Melissa stated application was not showing at this time, but it would take 24-72hours to upload.  Melissa stated they are currently working remotely. Melissa stated it may take longer to upload and process at this time.  Melissa stated she would check again this afternoon, and again in the morning.

## 2018-04-12 NOTE — Telephone Encounter (Signed)
Patient is returning phone call.  Patient phone number is [phone].

## 2018-04-12 NOTE — Telephone Encounter (Signed)
Called and spoke with [name] with [employer] at phone [phone] regarding OFEV application status She states the fax did not go through, they do not have the [place] application at this time Checked [name]'s box/folder and do not see OFEV application on the patient [employer] doesn't have it, and they are requesting it to be refaxed to [phone] for review  [name], where is this application located? It needs to be refaxed to [facility]. We have checked all folders, and it is not found. The application was suppose to be sent 04/02/2018 It can take 24-48 hrs for [employer] to see the fax of the application; it has been 10 days and no app is on file at [place], she is not aware of where this application for patient could be locations [name]-please advise. Thank you.

## 2018-04-12 NOTE — Telephone Encounter (Signed)
Patient states OFEV application needs to be faxed to [facility].  [place] fax number is [phone].  Patient phone number is [phone].

## 2018-04-12 NOTE — Telephone Encounter (Signed)
LMTCB for the pt  Margie- can you please let us know where this form is located

## 2018-04-12 NOTE — Telephone Encounter (Signed)
Unable to reach LMTCB 

## 2018-04-15 NOTE — Telephone Encounter (Signed)
Called [employer] at [phone] and spoke with Buddy to check to see if they had the application on file or if they were still needing it to be sent in and per Buddy, they still do not have application on file for pt.   Stated to [facility] that we had previously faxed the application and he checked another area and again stated that they do not have it. Stated to [facility] that we will refax the application in for pt.  Could not locate the original application that was filled out so have reprinted the application and have refilled it out ready for Dr. Vaughan Browner to sign. Pt will also have to sign a page of the application as well. Once it is resigned, we will refax it to [facility] for pt.  Routing to [employer] to follow up on as the application was handed to her.

## 2018-04-15 NOTE — Telephone Encounter (Signed)
Please see 04/02/18 phone note.  Pinion, [name], CMA     2:21 PM  Note    Went up front and pt's paperwork is still in the file cabinet up front with a note on there for pt to sign in highlighted areas.  Called and spoke with pt and wife [name] letting them know this and asked if they could come by office to sign forms so we could get things taken care of for the [place]. Stated to pt to just tell front staff that they are here for forms, which are located in the file folder and they could get the forms for pt to sign.  Pt and [name] expressed understanding.

## 2018-04-15 NOTE — Telephone Encounter (Signed)
Called and spoke with Patient.  Patient stated he was contacted by [facility] cares.  Patient was told application was received, and being processed.  Patient stated he was to call [employer] [facility] Wednesday, to follow up.   Advised Patient is he has any problems or questions to contact me.  I have ofev application, in envelope for Patient to sign, if needed.

## 2018-04-17 ENCOUNTER — Telehealth: Payer: Self-pay | Admitting: Pulmonary Disease

## 2018-04-17 DIAGNOSIS — M069 Rheumatoid arthritis, unspecified: Secondary | ICD-10-CM | POA: Diagnosis not present

## 2018-04-17 DIAGNOSIS — J441 Chronic obstructive pulmonary disease with (acute) exacerbation: Secondary | ICD-10-CM | POA: Diagnosis not present

## 2018-04-17 DIAGNOSIS — I10 Essential (primary) hypertension: Secondary | ICD-10-CM | POA: Diagnosis not present

## 2018-04-17 NOTE — Telephone Encounter (Signed)
Called and spoke with Patient. Patient stated he has not heard anything today  about Ofev start.  Called and spoke with Raynald Blend is currently trying to find what is needed for this Patient to start Ofev.  Per previous notes, application was faxed. Maudie Mercury is to call back this afternoon or in the morning, with info of what is further needed. Patient is aware I am waiting for Kim to call back. Will follow up.

## 2018-04-18 NOTE — Telephone Encounter (Signed)
Called and spoke with Tollie Pizza.  [facility]eded [facility] received for Dr Matilde Bash signature.  Form given to Dr Vaughan Browner.  Will [facility], once signed by Dr Vaughan Browner.

## 2018-04-19 NOTE — Telephone Encounter (Signed)
[facility] prescription signed by Dr Vaughan Browner, faxed to pharmacy.  Confirmation received.  Nothing further at this time.

## 2018-04-22 ENCOUNTER — Telehealth: Payer: Self-pay | Admitting: Pulmonary Disease

## 2018-04-22 NOTE — Telephone Encounter (Signed)
[facility], spoke with Audry Pili, he states the order is still in the review process, he states once the verification process is over he should be good to go but there is a delay at this time. Call made to patient to make aware. Voiced understanding. Nothing further is needed at this time.

## 2018-04-23 ENCOUNTER — Ambulatory Visit (INDEPENDENT_AMBULATORY_CARE_PROVIDER_SITE_OTHER): Payer: Medicare Other | Admitting: Nurse Practitioner

## 2018-04-23 ENCOUNTER — Other Ambulatory Visit: Payer: Self-pay

## 2018-04-23 ENCOUNTER — Encounter: Payer: Self-pay | Admitting: Nurse Practitioner

## 2018-04-23 ENCOUNTER — Telehealth: Payer: Self-pay | Admitting: Pulmonary Disease

## 2018-04-23 DIAGNOSIS — J849 Interstitial pulmonary disease, unspecified: Secondary | ICD-10-CM | POA: Diagnosis not present

## 2018-04-23 NOTE — Assessment & Plan Note (Signed)
Patient has a tele-visit today for a follow-up.  Was last seen by Dr. Vaughan Browner on 02/15/2018.  At his last visit he had lab work done and was ordered [employer].  The patient still has not started Ofev, but the orders and process has been started -  just waiting for final approval.  Patient states that this is been a stable interval for him.  He states that he has not been using his Trelegy inhaler because because he could not afford it.  Upon reviewing his chart it appears that he did not have his echo completed as ordered at last visit and has not had his follow-up CT scan completed at this time.  He never completed his 6-minute walk test.   Patient Instructions  Please contact your insurance to let us know which inhalers are covered so we can order you a new inhaler Patient is not taking trelegy due to cost Will order echocardiogram  Please call the office once you get the Ofev  - we will need to check lab work for monitoring  Follow up CT scan has been ordered - will need to be rescheduled due to Restrictions that are currently in place in setting of covid pandemic. Hopefully this can be done in July.  Follow up: Follow up with Dr. Vaughan Browner in July or sooner if needed - please schedule 6 minute walk on same day

## 2018-04-23 NOTE — Telephone Encounter (Signed)
Spoke with a representative about the OFEV prescription. She states a PA is needed through covermymeds. The key she provided is [id number]. Iniaited PA on CMM. Will await response from insurance.   Fax sent to the plan   Your PA has been faxed to the plan as a paper copy. Please contact the plan directly if you haven't received a determination in a typical timeframe.  You will be notified of the determination via fax.  How do I know if the plan approved the PA?  Add Reminder to your Dashboard  Remind me in:

## 2018-04-23 NOTE — Progress Notes (Signed)
Virtual Visit via Telephone Note  I connected with [name]. on 04/23/18 at  9:30 AM EDT by telephone and verified that I am speaking with the correct person using two identifiers.   I discussed the limitations, risks, security and privacy concerns of performing an evaluation and management service by telephone and the availability of in person appointments. I also discussed with the patient that there may be a patient responsible charge related to this service. The patient expressed understanding and agreed to proceed.   History of Present Illness: 73 year old with history of COPD, rheumatoid arthritis, hepatitis, hyperlipidemia who is followed by Dr. Vaughan Browner.  Recently diagnosed with rheumatoid arthritis.  Follows with Dr Kathlene November, Rheumatology.  Patient has a tele-visit today for a follow-up.  Was last seen by Dr. Vaughan Browner on 02/15/2018.  At his last visit he had lab work done and was ordered [employer].  The patient still has not started Ofev, but the orders and process has been started -  just waiting for final approval.  Patient states that this is been a stable interval for him.  He states that he has not been using his Trelegy inhaler because because he could not afford it.  Upon reviewing his chart it appears that he did not have his echo completed as ordered at last visit and has not had his follow-up CT scan completed at this time.  He never completed his 6-minute walk test. Denies f/c/s, n/v/d, hemoptysis, PND, leg swelling.    Observations/Objective: CT abdomen pelvis 10/27/2013- visualized lung bases show peripheral honeycombing with mild fibrosis, reticulation, architectural distortion.    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 Bullous emphysematous changes, new nodular lesion in the left upper lobe.  Enlarged pulmonary artery. I have reviewed the images personally.  PFTs 02/15/2018 FVC 1.77 (46%), FEV1  1.49 (52%), F/F 84, TLC 44%, DLCO 33% Severe restriction, diffusion impairment.  FENO 02/15/2018- 19  Assessment and Plan: Patient has a tele-visit today for a follow-up.  Was last seen by Dr. Vaughan Browner on 02/15/2018.  At his last visit he had lab work done and was ordered [employer].  The patient still has not started Ofev, but the orders and process has been started -  just waiting for final approval.  Patient states that this is been a stable interval for him.  He states that he has not been using his Trelegy inhaler because because he could not afford it.  Upon reviewing his chart it appears that he did not have his echo completed as ordered at last visit and has not had his follow-up CT scan completed at this time.  He never completed his 6-minute walk test.   Patient Instructions  Please contact your insurance to let us know which inhalers are covered so we can order you a new inhaler Patient is not taking trelegy due to cost Will order echocardiogram  Please call the office once you get the Ofev  - we will need to check lab work for monitoring  Follow up CT scan has been ordered - will need to be rescheduled due to Restrictions that are currently in place in setting of covid pandemic. Hopefully this can be done in July.   Follow Up Instructions: Follow up: Follow up with Dr. Vaughan Browner in July or sooner if needed - please schedule 6 minute walk on same day    I discussed the assessment and treatment plan with the patient. The patient was provided an  opportunity to ask questions and all were answered. The patient agreed with the plan and demonstrated an understanding of the instructions.   The patient was advised to call back or seek an in-person evaluation if the symptoms worsen or if the condition fails to improve as anticipated.  I provided 22 minutes of non-face-to-face time during this encounter.   Fenton Foy, NP

## 2018-04-23 NOTE — Patient Instructions (Addendum)
Please contact your insurance to let us know which inhalers are covered so we can order you a new inhaler Patient is not taking trelegy due to cost Will order echocardiogram  Please call the office once you get the Ofev  - we will need to check lab work for monitoring  Follow up CT scan has been ordered - will need to be rescheduled due to Restrictions that are currently in place. Hopefully this can be done in July.  Follow up: Follow up with Dr. Vaughan Browner in July or sooner if needed - please schedule 6 minute walk on same day

## 2018-04-24 NOTE — Telephone Encounter (Signed)
PA key did not match with the patients name and DOB. Key was too long. Only needed 8 characters at most. [facility] Unable to reach anyone as wait time exceeded 25 minutes.

## 2018-04-25 NOTE — Telephone Encounter (Signed)
    Rayquon Lovena Neighbours (KeyToney Rakes - rx # [id number]   Need help? Call us at [phone]   Status  Sent to [place] March 31 Next Steps The plan will fax you a determination, typically within 1 to 5 business days.

## 2018-04-26 ENCOUNTER — Encounter: Payer: Self-pay | Admitting: General Surgery

## 2018-04-26 ENCOUNTER — Telehealth: Payer: Self-pay

## 2018-04-26 NOTE — Telephone Encounter (Signed)
Call made to [place] [phone]. Inquired as to why Ofev was denied. Rep states there is no alternative but it was denied due to lack of clinical support. It is on the insurance but they need more clinical information. Rep states a letter of clinical necessity will be needed. Form that we have has the information that will need to be included in the letter.  Will route message to TN as she was the last NP that saw patient.   TN please advise. Thanks.

## 2018-04-26 NOTE — Telephone Encounter (Signed)
I have called and spoke with [facility]. I have she was given the DX. Code  [name] (Key: [id number]    When entering this in to cover meds last name is Helmer Dull or it will not come up.  Status  Sent to [facility] March 31 Next Steps The plan will fax you a determination, typically within 1 to 5 business days.

## 2018-04-29 ENCOUNTER — Telehealth: Payer: Self-pay | Admitting: Pulmonary Disease

## 2018-04-29 ENCOUNTER — Encounter: Payer: Self-pay | Admitting: *Deleted

## 2018-04-29 MED ORDER — PREDNISONE 10 MG PO TABS: 20.0000 mg | ORAL_TABLET | Freq: Every day | ORAL | 0 refills | Status: AC

## 2018-04-29 MED ORDER — DOXYCYCLINE HYCLATE 100 MG PO TABS: 100.0000 mg | ORAL_TABLET | Freq: Two times a day (BID) | ORAL | 0 refills | Status: DC

## 2018-04-29 NOTE — Telephone Encounter (Signed)
Returned call to patient and made aware prednisone and abx has been sent to his [employer]. Nothing further needed.

## 2018-04-29 NOTE — Telephone Encounter (Signed)
Letter has been typed up and am sending the letter along with other supportive documentation to help with the appeal.  Called [employer] PA dept to get fax number for the expedited PA dept. Spoke with Lexine Baton to obtain the fax number for expedited appeals dept. Per Lexine Baton, info could be faxed to [phone].  Info for the appeal has been faxed to the [employer]. Will await response on the appeal.

## 2018-04-29 NOTE — Telephone Encounter (Signed)
TN please advise on the message below. Pt's Ofev is being denied for patient.

## 2018-04-29 NOTE — Telephone Encounter (Signed)
I sent in a prescription for prednisone and an antibiotic.

## 2018-04-29 NOTE — Telephone Encounter (Signed)
Primary Pulmonologist: PM Last office visit and with whom: TN on 04/23/2018 What do we see them for (pulmonary problems): ILD; SOB Last OV assessment/plan:  Versions: 1. Travis Foy, NP (Nurse Practitioner) at 04/23/2018 9:15 AM - Signed    Please contact your insurance to let us know which inhalers are covered so we can order you a new inhaler Patient is not taking trelegy due to cost Will order echocardiogram  Please call the office once you get the Ofev  - we will need to check lab work for monitoring  Follow up CT scan has been ordered - will need to be rescheduled due to Restrictions that are currently in place. Hopefully this can be done in July.  Follow up: Follow up with Dr. Vaughan Browner in July or sooner if needed - please schedule 6 minute walk on same day     Was appointment offered to patient (explain)?  Pt's wife prefers not to come in office  Reason for call: Pt reports productive cough-dark green thick mucus, increase wheezing and SOB with exertion. Pt is okay during the daytime, the breathing and coughing is worse during the night. Pt reports no fever, no chills, no body aches, no sinus pressures, no covid exposure, been around no one sick, nor has travelled or been around anyone that has travelled in months. Pt is taking his trelegy as of yesterday.  TN please advise. Thank you.

## 2018-04-29 NOTE — Telephone Encounter (Signed)
  The plan will fax you a determination, typically within 1 to 5 business days.   Drug Ofev 150MG  capsules Form [employer] Medicare Prescription Drug Coverage Determination Form  Use for Medicare Prescription Drug Coverage Determination Requests [phone]  Will follow up

## 2018-04-29 NOTE — Telephone Encounter (Signed)
Yes. Please make letter for necessity. Thanks.

## 2018-04-30 NOTE — Telephone Encounter (Signed)
[name] (Key: [id number]) - rx # [id number]   Need help? Call us at [phone]   Status  Sent to [place] March 31 Next Steps The plan will fax you a determination, typically within 1 to 5 business days.   Drug Ofev 150MG  capsules Form [employer] Medicare Prescription Drug Coverage Determination Form  Use for Medicare Prescription Drug Coverage Determination Requests [phone]

## 2018-05-01 NOTE — Telephone Encounter (Signed)
Checked with Waldemar Dickens, no appeal decision received.  Also checked in Dr Matilde Bash lookat, nothing received.

## 2018-05-01 NOTE — Telephone Encounter (Signed)
Duplicate messsage - see phone note 04/26/18 regarding update.  Will close this encounter.

## 2018-05-02 NOTE — Telephone Encounter (Signed)
No appeal decision at this time, checked PA folders this morning.  Also checked [place] look at folder, nothing rec'd at this time.

## 2018-05-07 ENCOUNTER — Telehealth: Payer: Self-pay | Admitting: Pulmonary Disease

## 2018-05-07 NOTE — Telephone Encounter (Signed)
PA letter in [place]'s box. Attached letter from fax requested more info on patient.  The patient form appears to have been faxed by Lattie Haw T. And signed by [employer] then returned to [place] again for more info.  Discussed with Emily P. And additional info added re: diagnosis and successfully re-faxed again to [phone].  Form returned to Mann[place]'s box.

## 2018-05-07 NOTE — Telephone Encounter (Signed)
ATC pt, went to voicemail. LMTCB.  

## 2018-05-08 NOTE — Telephone Encounter (Signed)
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

## 2018-05-10 NOTE — Telephone Encounter (Signed)
PA approval for Ofev fax received. Spoke with Patient.  Ofev received and medication started.  Patient denies any problems.  Nothing further at this time.

## 2018-05-10 NOTE — Telephone Encounter (Signed)
[name], please advise if you have received any additional info on pt and his OFEV med. Thanks!

## 2018-05-13 NOTE — Telephone Encounter (Signed)
Still waiting on response from Dr. Vaughan Browner in regards to this. Pt is aware that Dr. Vaughan Browner is over at the hospital and once able, we will call him back with a response.

## 2018-05-15 NOTE — Telephone Encounter (Signed)
ATC Patient.  Left message for Patient to call back when available.  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 Asbestos exposure can also cause this but it is difficult to say. Asbestos exposure typically has other findings like thickening of lung lining that we do not see here.

## 2018-05-15 NOTE — Telephone Encounter (Signed)
His ILD is likely caused by the rheumatoid arthritis. Asbestos exposure can also cause this but it is difficult to say. Asbestos exposure typically has other findings like thickening of lung lining that we do not see here.

## 2018-05-16 NOTE — Telephone Encounter (Signed)
Contacted patient by phone to relay Dr. Matilde Bash response to his questions regarding the cause of his ILD.  Read patient provider note. 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  Advised patient that Dr. Vaughan Browner would be able to discuss further at his next face-to-face encounter which patient in the office.  Patient is agreeable and recall placed for July appointment. Nothing further needed for this encounter.

## 2018-05-17 ENCOUNTER — Ambulatory Visit: Payer: Medicare Other | Admitting: Pulmonary Disease

## 2018-05-23 ENCOUNTER — Telehealth: Payer: Self-pay | Admitting: Pulmonary Disease

## 2018-05-23 NOTE — Telephone Encounter (Signed)
Called patient and reminded:nothing further needed. Copied from previous note: Contacted patient by phone to relay Dr. Matilde Bash response to his questions regarding the cause of his ILD.  Read patient provider note. 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  Advised patient that Dr. Vaughan Browner would be able to discuss further at his next face-to-face encounter which patient in the office.  Patient is agreeable and recall placed for July appointment. Nothing further needed for this encounter.

## 2018-06-14 ENCOUNTER — Telehealth: Payer: Self-pay

## 2018-06-14 MED ORDER — DOXYCYCLINE HYCLATE 100 MG PO TABS: 100.0000 mg | ORAL_TABLET | Freq: Two times a day (BID) | ORAL | 0 refills | Status: DC

## 2018-06-14 MED ORDER — PREDNISONE 10 MG PO TABS: 20.0000 mg | ORAL_TABLET | Freq: Every day | ORAL | 0 refills | Status: AC

## 2018-06-14 NOTE — Telephone Encounter (Signed)
Spoke with wife and advised message from [place] She understood and nothing further is needed. She didn't want to schedule a televisit at this time but stated she would call and give [place] an update next week.

## 2018-06-14 NOTE — Telephone Encounter (Signed)
Please advise patient to stop Ofev for the next 2 weeks to see if symptoms resolve. I have ordered doxycycline and prednisone. If symptoms worsen over the weekend please go to the ED. Please make follow up e-visit with Dr. Vaughan Browner next week if his schedule is open. If not, then please make tele-visit Tuesday with APP. Thanks.   Clear liquids and advance as tolerated Tylenol for fever if needed

## 2018-06-14 NOTE — Telephone Encounter (Signed)
Wife phone office stating he is having increased SOB, increased wheezing, coughing up thick phlegm, loosing weight, loss of appetite, reports have chills. She reports this has been happening for about 2 weeks. She states this started once he started taking OFEV. She thinks he has been running a fever. Diarrhea.  [phone], she requests that we call her back on her cell phone.   [place] please advise. Thanks.

## 2018-06-18 DIAGNOSIS — M79643 Pain in unspecified hand: Secondary | ICD-10-CM | POA: Diagnosis not present

## 2018-06-18 DIAGNOSIS — M79673 Pain in unspecified foot: Secondary | ICD-10-CM | POA: Diagnosis not present

## 2018-06-18 DIAGNOSIS — M7989 Other specified soft tissue disorders: Secondary | ICD-10-CM | POA: Diagnosis not present

## 2018-06-18 DIAGNOSIS — M549 Dorsalgia, unspecified: Secondary | ICD-10-CM | POA: Diagnosis not present

## 2018-06-18 DIAGNOSIS — M0579 Rheumatoid arthritis with rheumatoid factor of multiple sites without organ or systems involvement: Secondary | ICD-10-CM | POA: Diagnosis not present

## 2018-06-26 ENCOUNTER — Telehealth: Payer: Self-pay | Admitting: Pulmonary Disease

## 2018-06-26 NOTE — Telephone Encounter (Signed)
Pt says he was contacted about shipment of his ofev. He told them med had been stopped. Pt has a f/u on 6/11. Called [facility] [phone] Per [facility] if patient is taken of med he can contact them to have removed from his profile. It has been noted that med has been temp d/c. Nothing further needed.

## 2018-07-01 ENCOUNTER — Telehealth: Payer: Self-pay | Admitting: Pulmonary Disease

## 2018-07-01 NOTE — Telephone Encounter (Signed)
Travis Garfinkel, MD  Elton Sin, LPN        Please make sure the CT scan order this month is high res CT.   Make follow up appointment this month (video tele preferred) with me after CT scan   ATC Patient.  LMTCB to schedule follow up visit after CT for results.  CT chest scheduled 07/04/18.

## 2018-07-02 NOTE — Telephone Encounter (Signed)
ATC Patient.  Left detailed message to call and schedule video /tele visit with Dr Vaughan Browner after 6/11 CT chest.

## 2018-07-03 NOTE — Telephone Encounter (Signed)
Called and spoke with Patient. Patient requested I call his wife, [name], [phone], to schedule OV.  Spoke with [name], she stated they are in [place] for another [place] 6/19, and would prefer in person appointment.  Follow up OV scheduled 07/12/18, at 1130, with Dr Vaughan Browner for CT chest results.  Nothing further needed at this time.

## 2018-07-04 ENCOUNTER — Other Ambulatory Visit: Payer: Self-pay

## 2018-07-04 ENCOUNTER — Ambulatory Visit (HOSPITAL_COMMUNITY)
Admission: RE | Admit: 2018-07-04 | Discharge: 2018-07-04 | Disposition: A | Discharge status: Home / Self Care | Payer: Medicare Other | Source: Ambulatory Visit | Attending: Pulmonary Disease | Admitting: Pulmonary Disease

## 2018-07-04 DIAGNOSIS — R918 Other nonspecific abnormal finding of lung field: Secondary | ICD-10-CM | POA: Diagnosis not present

## 2018-07-04 DIAGNOSIS — R911 Solitary pulmonary nodule: Secondary | ICD-10-CM | POA: Diagnosis not present

## 2018-07-04 DIAGNOSIS — I2510 Atherosclerotic heart disease of native coronary artery without angina pectoris: Secondary | ICD-10-CM | POA: Diagnosis not present

## 2018-07-04 DIAGNOSIS — I700 Atherosclerosis of aorta: Secondary | ICD-10-CM | POA: Diagnosis not present

## 2018-07-04 DIAGNOSIS — J439 Emphysema, unspecified: Secondary | ICD-10-CM | POA: Diagnosis not present

## 2018-07-09 ENCOUNTER — Other Ambulatory Visit: Payer: Self-pay | Admitting: Pulmonary Disease

## 2018-07-09 ENCOUNTER — Telehealth: Payer: Self-pay

## 2018-07-09 DIAGNOSIS — R911 Solitary pulmonary nodule: Secondary | ICD-10-CM

## 2018-07-09 NOTE — Progress Notes (Signed)
LMOM TCB x1 for patient to discuss results - advised patient he could ask to speak with Leander Rams or myself Judeen Hammans [facility] called [facility] and they have openings for PET for 6.18.2020 @ 2pm and 4pm - have asked them to hold the 2pm slot for patient so that Dr Vaughan Browner can have results in time for appt on 6.19.2020.

## 2018-07-09 NOTE — Telephone Encounter (Signed)
Spoke with pt's wife and advised her that we must have tried to call and confirm his appt on Friday 07/12/2018 with Dr. Thalia Party 11:00am. She agreed and nothing further is needed.

## 2018-07-10 NOTE — Progress Notes (Signed)
Called spoke with patient and spouse [name] (dpr on file), advised of CT results / recs and pending PET as stated by Dr Vaughan Browner.  Pt verbalized understanding and denied any questions.

## 2018-07-11 ENCOUNTER — Other Ambulatory Visit: Payer: Self-pay

## 2018-07-11 ENCOUNTER — Ambulatory Visit (HOSPITAL_COMMUNITY)
Admission: RE | Admit: 2018-07-11 | Discharge: 2018-07-11 | Disposition: A | Discharge status: Home / Self Care | Payer: Medicare Other | Source: Ambulatory Visit | Attending: Pulmonary Disease | Admitting: Pulmonary Disease

## 2018-07-11 DIAGNOSIS — J439 Emphysema, unspecified: Secondary | ICD-10-CM | POA: Diagnosis not present

## 2018-07-11 DIAGNOSIS — R590 Localized enlarged lymph nodes: Secondary | ICD-10-CM | POA: Diagnosis not present

## 2018-07-11 DIAGNOSIS — I700 Atherosclerosis of aorta: Secondary | ICD-10-CM | POA: Insufficient documentation

## 2018-07-11 DIAGNOSIS — R911 Solitary pulmonary nodule: Secondary | ICD-10-CM | POA: Diagnosis not present

## 2018-07-11 DIAGNOSIS — R918 Other nonspecific abnormal finding of lung field: Secondary | ICD-10-CM | POA: Diagnosis not present

## 2018-07-11 DIAGNOSIS — R599 Enlarged lymph nodes, unspecified: Secondary | ICD-10-CM | POA: Diagnosis not present

## 2018-07-11 LAB — GLUCOSE, CAPILLARY: Glucose-Capillary: 107 mg/dL — ABNORMAL HIGH (ref 70–99)

## 2018-07-11 MED ORDER — FLUDEOXYGLUCOSE F - 18 (FDG) INJECTION
7.2300 | Freq: Once | INTRAVENOUS | Status: AC
  Administered 2018-07-11: 7.23 via INTRAVENOUS

## 2018-07-12 ENCOUNTER — Encounter: Payer: Self-pay | Admitting: Pulmonary Disease

## 2018-07-12 ENCOUNTER — Ambulatory Visit: Payer: Medicare Other | Admitting: Pulmonary Disease

## 2018-07-12 VITALS — BP 132/68 | HR 107 | Temp 98.9°F | Ht 71.0 in | Wt 145.6 lb

## 2018-07-12 DIAGNOSIS — J849 Interstitial pulmonary disease, unspecified: Secondary | ICD-10-CM | POA: Diagnosis not present

## 2018-07-12 MED ORDER — LEVOFLOXACIN 500 MG PO TABS: 500.0000 mg | ORAL_TABLET | Freq: Every day | ORAL | 0 refills | Status: DC

## 2018-07-12 MED ORDER — PROBIOTIC ACIDOPHILUS PO CAPS: 1.0000 | ORAL_CAPSULE | Freq: Every day | ORAL | 0 refills | Status: DC

## 2018-07-12 MED ORDER — PREDNISONE 10 MG PO TABS: ORAL_TABLET | ORAL | 0 refills | Status: DC

## 2018-07-12 NOTE — Patient Instructions (Addendum)
We will try to get some records from Dr. Kathlene November and Dr. Luan Pulling regarding your recent treatment  We will give you a prednisone taper starting at 40 mg.  Reduce dose by 10 mg every 3 days Start levofloxacin 500 mg/day for 7 days Continue the Trelegy inhaler We will call you on duo nebs and nebulizer  We will send sputum for culture and check COVID 19 test  Follow up in 2 weeks Instructed patient to seek care in emergency room if he gets worse

## 2018-07-12 NOTE — Progress Notes (Addendum)
[name]    [id number]    1945-11-28  Primary Care Physician:Berry, Travis Miller, MD  Referring Physician: Sinda Du, MD [address] [place],  [place] [address]  Chief complaint: Follow up for RA-ILD  HPI: 73 year old with history of COPD, rheumatoid arthritis, hepatitis, hyperlipidemia. Recently diagnosed with rheumatoid arthritis.  Follows with Dr [name], Rheumatology.  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  No mold, hot tub, Jacuzzi, humidifier Smoking history: 12.5-pack-year history.  Quit in 2009 Travel history: Previously lived in [place].  No significant recent travel Relevant family history: No significant family history of lung disease.  Interim history: Patient was started on Ofev in April of 2020 for progressive UIP fibrosis and RA-ILD.  He took it for about 2 months but stopped due to GI disturbance, loss of weight.  He has symptoms of recurrent cough with green mucus, fevers, chills, dyspnea, wheezing for the past month. Treated with prednisone and antibiotics in April.  Patient tells me that he also received another round of prednisone and antibiotics from his primary care.  Outpatient Encounter Medications as of 07/12/2018  Medication Sig  . amLODipine (NORVASC) 5 MG tablet Take 5 mg by mouth daily.  . celecoxib (CELEBREX) 200 MG capsule Take 200 mg by mouth daily.  . Cholecalciferol (VITAMIN D3) 10 MCG (400 UNIT) tablet Take 400 Units by mouth daily.  . famotidine (PEPCID) 10 MG tablet Take 10 mg by mouth daily as needed for heartburn or indigestion.  . Fluticasone-Umeclidin-Vilant (TRELEGY ELLIPTA) 100-62.5-25 MCG/INH AEPB Inhale 1 puff into the lungs daily.  . hydroxychloroquine (PLAQUENIL) 200 MG tablet Take 400 mg by mouth daily.  Marland Kitchen ipratropium-albuterol (DUONEB) 0.5-2.5 (3) MG/3ML SOLN Take 3 mLs by nebulization 4 (four) times  daily.   Marland Kitchen PROAIR HFA 108 (90 BASE) MCG/ACT inhaler Inhale 2 puffs into the lungs 4 (four) times daily as needed for wheezing or shortness of breath.   . simvastatin (ZOCOR) 20 MG tablet Take 20 mg by mouth at bedtime.   . tamsulosin (FLOMAX) 0.4 MG CAPS capsule Take 0.4 mg by mouth daily.  . [DISCONTINUED] doxycycline (VIBRA-TABS) 100 MG tablet Take 1 tablet (100 mg total) by mouth 2 (two) times daily.  . [DISCONTINUED] doxycycline (VIBRA-TABS) 100 MG tablet Take 1 tablet (100 mg total) by mouth 2 (two) times daily.   No facility-administered encounter medications on file as of 07/12/2018.    Physical Exam: Gen:      No acute distress HEENT:  EOMI, sclera anicteric Neck:     No masses; no thyromegaly Lungs:    Bilateral expiratory wheeze CV:         Regular rate and rhythm; no murmurs Abd:      + bowel sounds; soft, non-tender; no palpable masses, no distension Ext:    No edema; adequate peripheral perfusion Skin:      Warm and dry; no rash Neuro: alert and oriented x 3   Data Reviewed: Imaging: CT abdomen pelvis 10/27/2013- visualized lung bases show peripheral honeycombing with mild fibrosis, reticulation, architectural distortion.    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 Bullous emphysematous changes, new nodular lesion in the left upper lobe.  Enlarged pulmonary artery.  CT 07/04/2018-irregular nodule/consolidation in the left upper lobe with increase in size compared to previous.  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  PET scan 07/11/2018- left upper lobe irregular pulmonary nodule is hypermetabolic.  There is also superimposed patchy consolidation throughout the apical left upper lobe.  Hypermetabolic AP window and left prevascular lymphadenopathy. I have reviewed the images personally.  PFTs 02/15/2018 FVC 1.77 (46%), FEV1 1.49 (52%), F/F 84, TLC 44%, DLCO 33% Severe  restriction, diffusion impairment.  FENO 02/15/2018- 19  Labs Hypersensitivity panel 02/15/2018-negative Alpha-1 antitrypsin 02/15/2018-161, PIMM  CTD serologies 02/15/2018- ANA negative, myositis panel-negative  Ro, [place], SCL 70-Negative CCP 108, rheumatoid factor 54  Assessment:  Left upper lobe pneumonia Has reccurent symptoms of cough with green mucus and fevers Recent imaging reviewed with left upper lobe nodular consolidation and groundglass opacities suggestive of pneumonia.  We will get records from his primary care to see what treatment he has received so far Call in prednisone taper and levofloxacin, probiotics Check sputum culture and COVID test If symptoms get worse then he may need to go to ER to be evaluated  RA-ILD Imaging shows progressive honeycombing, basilar fibrosis and UIP pattern.  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  Although he has exposure of asbestosis in the past this is less likely to be asbestosis.  He has not tolerated Ofev earlier this year. Will reassess when he is more stable.  Get records from Dr. Kathlene Berry regarding his RA treatment  Abnormal CT, PET Evaluated for the enlarging left upper lobe nodular consolidation.  On PET scan this week there is diffuse uptake within the left upper lobe.  We are currently treating him for pneumonia.  Will need follow-up scanning to ensure there is no underlying malignancy  Emphysema Continue Trelegy, nebs  Enlarged pulmonary artery on CT Echo pending   Updated wife over telephone.  More then 1/2 the time of the 40 min visit was spent in counseling and/or coordination of care with the patient and family.  Plan/Recommendations: - Continue Trelegy inhaler, nebs - Prednisone taper, levofloxacin - Sputum culture, COVID testing  Return in 2 weeks  Travis Garfinkel MD [facility] 07/12/2018, 11:41 AM  CC: Travis Du, MD  Addendum: Received note  from Dr. Kathlene Berry, [place] dated 06/18/2018 Being followed for seropositive rheumatoid arthritis [CCP greater than 250]  Stopped Plaquenil as its not helping.   RA treatment is complicated by hepatitis C and ILD Holding azathioprine and other modalities to avoid immunosuppression until hepatitis C treatment is complete.  Patient has been referred to a hepatologist.  Methotrexate and leflunomide is contraindicated due to interstitial lung disease In the meantime he has received a prednisone taper.  Travis Garfinkel MD [facility] 08/04/2018, 3:19 PM   Addendum: Received note from Dr. Aryal, [place] dated 07/29/2018 Patient is now on treatment for hepatitis C.  He is also been started on prednisone and azathioprine. Azathioprine uptitrated 200 mg daily and prednisone at 10 mg daily. If symptoms do not improve then may need additional DMARD such as rituximab or Actemra  Travis Garfinkel MD [facility] 08/12/2018, 5:45 PM

## 2018-07-15 ENCOUNTER — Telehealth: Payer: Self-pay | Admitting: Pulmonary Disease

## 2018-07-15 ENCOUNTER — Telehealth: Payer: Self-pay | Admitting: *Deleted

## 2018-07-15 DIAGNOSIS — Z20822 Contact with and (suspected) exposure to covid-19: Secondary | ICD-10-CM

## 2018-07-15 NOTE — Addendum Note (Signed)
Addended by: Dimple Nanas on: 07/15/2018 04:41 PM   Modules accepted: Orders

## 2018-07-15 NOTE — Telephone Encounter (Signed)
Spoke with wife. She stated that he had stopped taking the Ofev since the 6/19 visit. She stated that the patient really want to discontinue the medication. She will discuss lowering the dosage with the patient to see if he is interested or if he wants to stop the medication all together. She will call us back once he has decided what he wants to do.

## 2018-07-15 NOTE — Telephone Encounter (Signed)
Patient called to schedule COVID-19 testing but no answer at this time. Unable to leave voicemail message, mailbox full.

## 2018-07-15 NOTE — Telephone Encounter (Signed)
Attempted to contact patient regarding scheduling COVID 19 test.  No answer, mailbox full.

## 2018-07-15 NOTE — Telephone Encounter (Signed)
-----   Message from Elton Sin, LPN sent at 0/40/4591 12:06 PM EDT ----- Dr Vaughan Browner requesting covid testing, SHOB, fever, fatigue

## 2018-07-15 NOTE — Telephone Encounter (Signed)
LMTCB

## 2018-07-15 NOTE — Telephone Encounter (Signed)
Please clarify with wife if he is still on it. Patient told me last week that he has stopped taking it.  If he is still taking it then we can reduce dose to 100mg  twice daily. If he still has side effects with the low dose then we will stop the medication  Marshell Garfinkel MD [facility] 07/15/2018, 5:22 PM

## 2018-07-15 NOTE — Telephone Encounter (Signed)
Pt wife is returning call. Cb is [phone].

## 2018-07-15 NOTE — Telephone Encounter (Signed)
Spoke with patient's wife [name]. She stated that the patient has been on Ofev 150mg  since April 15th. Since then, he has been having issues with diarrhea, loss of appetite and weight loss. She and the patient have discussed possibly lowering the Ofev dose to 100mg . She wants to know what Dr. Vaughan Browner thinks of this.   Advised her that I would send a message over to Dr. Vaughan Browner. She verbalized understanding.   Dr. Vaughan Browner, please advise. Thanks!

## 2018-07-15 NOTE — Telephone Encounter (Signed)
Spoke with pt's wife [name] and pt scheduled for COVID-19 testing on 07/16/18 at 8 am at [facility]. Pt's wife advised that all occupants in the car would need to wear a mask and remain in car at the time of appt. Understanding verbalized.

## 2018-07-16 ENCOUNTER — Other Ambulatory Visit: Payer: Medicare Other

## 2018-07-16 DIAGNOSIS — R6889 Other general symptoms and signs: Secondary | ICD-10-CM | POA: Diagnosis not present

## 2018-07-16 DIAGNOSIS — Z20822 Contact with and (suspected) exposure to covid-19: Secondary | ICD-10-CM

## 2018-07-19 LAB — NOVEL CORONAVIRUS, NAA: SARS-CoV-2, NAA: NOT DETECTED

## 2018-07-24 ENCOUNTER — Telehealth: Payer: Self-pay | Admitting: Pulmonary Disease

## 2018-07-24 NOTE — Telephone Encounter (Signed)
See other message

## 2018-07-24 NOTE — Telephone Encounter (Signed)
Called and left a message for Patient or Patient's wife to call back regarding ofev.

## 2018-07-24 NOTE — Telephone Encounter (Signed)
ATC patient.  Left message for Patient or [name], Patient's Wife to call back to follow up on Ofev.   Previous telephone message from 07/15/18  Spoke with wife. She stated that he had stopped taking the Ofev since the 6/19 visit. She stated that the patient really want to discontinue the medication. She will discuss lowering the dosage with the patient to see if he is interested or if he wants to stop the medication all together. She will call us back once he has decided what he wants to do.

## 2018-07-24 NOTE — Telephone Encounter (Signed)
Per Dr Vaughan Browner 07/15/18- Please clarify with wife if he is still on it. Patient told me last week that he has stopped taking it.  If he is still taking it then we can reduce dose to 100mg  twice daily. If he still has side effects with the low dose then we will stop the medication  Marshell Garfinkel MD [facility] 07/15/2018, 5:22 PM

## 2018-07-29 DIAGNOSIS — M79643 Pain in unspecified hand: Secondary | ICD-10-CM | POA: Diagnosis not present

## 2018-07-29 DIAGNOSIS — M79673 Pain in unspecified foot: Secondary | ICD-10-CM | POA: Diagnosis not present

## 2018-07-29 DIAGNOSIS — M549 Dorsalgia, unspecified: Secondary | ICD-10-CM | POA: Diagnosis not present

## 2018-07-29 DIAGNOSIS — M0579 Rheumatoid arthritis with rheumatoid factor of multiple sites without organ or systems involvement: Secondary | ICD-10-CM | POA: Diagnosis not present

## 2018-07-29 DIAGNOSIS — M7989 Other specified soft tissue disorders: Secondary | ICD-10-CM | POA: Diagnosis not present

## 2018-07-29 NOTE — Telephone Encounter (Signed)
Attempted to call pt or wife [name] but unable to reach. Left message for them to return call.  Pt does have an OV scheduled tomorrow, 7/7 at 9:30 with Wyn Quaker.  Routing to [place] as an FYI so he can ask pt about OFEV based on information stated in this phone encounter from into stated by Dr. Vaughan Browner.

## 2018-07-29 NOTE — Telephone Encounter (Signed)
Okay noted.   [name]

## 2018-07-29 NOTE — Progress Notes (Signed)
@Patient  ID: Travis Kelch., male    DOB: Nov 02, 1945, 73 y.o.   MRN: [id number]  Chief Complaint  Patient presents with   Follow-up    ILD follow up - still having prod cough, and increased SHOB/tightness when lying down    Referring provider: Sinda Du, MD  HPI:  73 year old male former smoker followed in our office for COPD, RA ILD, emphysema  PMH: Rheumatoid arthritis (followed by Dr. Jeannie Fend [facility] rheumatology), hepatitis, hyperlipidemia Smoker/ Smoking History: Former smoker.  Quit 2009. Maintenance: Trelegy Ellipta Pt of: Dr. 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  No mold, hot tub, Jacuzzi, humidifier Smoking history: 12.5-pack-year history.  Quit in 2009 Travel history: Previously lived in [place].  No significant recent travel Relevant family history: No significant family history of lung disease.  07/30/2018  - Visit   72 year old male former smoker presenting to our office today for an acute visit.  Patient was recently seen on 07/12/2018.  Patient was prescribed Levaquin, prednisone taper as well as instructed to provide a sputum culture.  Patient was also instructed to follow-up in 2 weeks.  Patient is presenting today on 07/30/2018 for his follow-up.  He dropped off a sputum culture today.    There continues to be questions regarding the patient's tolerance of 0FEV.  Patient is struggled with tolerance due to the side effects.  Patient continues to be maintained on Trelegy Ellipta.  Patient feels that there is been clinical improvement since starting Ofev but was concerned that the dose was too high and this was causing him to lose weight.  Patient continues also follow-up with his primary care provider Dr. Luan Pulling as well as for [facility] Dr. Daria Pastures for rheumatology.  Attempts to request records from these offices have been  unsuccessful.  We will request again today.  Patient reporting that he is now maintained on 10 mg prednisone daily based off of rheumatology recommendation.  Patient is willing to try OFEV again at a reduced dose.  Patient also felt that he clinically felt better when he was taking the Levaquin and the prednisone.  Dr. Matilde Bash most recent note makes reference to an echocardiogram which is pending but there are no results in the chart.  Patient reporting today that he does not believe that he has had this completed.  CT scan shows enlarged pulmonary artery suggestive of pulmonary arterial hypertension.  Likely has pulmonary hypertension due to severe bullous emphysema as well as RA ILD.     Tests:   Imaging: CT abdomen pelvis 10/27/2013- visualized lung bases show peripheral honeycombing with mild fibrosis, reticulation, architectural distortion.    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 Bullous emphysematous changes, new nodular lesion in the left upper lobe.  Enlarged pulmonary artery.  CT 07/04/2018-irregular nodule/consolidation in the left upper lobe with increase in size compared to previous.  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  PET scan 07/11/2018- left upper lobe irregular pulmonary nodule is hypermetabolic.  There is also superimposed patchy consolidation throughout the apical left upper lobe.  Hypermetabolic AP window and left prevascular lymphadenopathy. I have reviewed the images personally.  PFTs 02/15/2018 FVC 1.77 (46%), FEV1 1.49 (52%), F/F 84, TLC 44%, DLCO 33% Severe restriction, diffusion impairment.  FENO 02/15/2018- 19  Labs Hypersensitivity panel 02/15/2018-negative Alpha-1 antitrypsin 02/15/2018-161, PIMM  CTD serologies 02/15/2018- ANA  negative, myositis panel-negative  Ro, La, SCL 70-Negative CCP 108, rheumatoid factor 54  07/16/2018-SARS-CoV-2-not detected  SIX  MIN WALK 02/15/2018  Supplimental Oxygen during Test? (L/min) No  Tech Comments: Pt. completed 1 1/2 laps, but had to stop due to dizziness and legs "feeling like noodles". Did not desat.      FENO:  Lab Results  Component Value Date   NITRICOXIDE 19 02/15/2018    PFT: PFT Results Latest Ref Rng & Units 02/15/2018  FVC-Pre L 1.87  FVC-Predicted Pre % 49  FVC-Post L 1.77  FVC-Predicted Post % 46  Pre FEV1/FVC % % 78  Post FEV1/FCV % % 84  FEV1-Pre L 1.46  FEV1-Predicted Pre % 51  FEV1-Post L 1.49  DLCO UNC% % 23  DLCO COR %Predicted % 61  TLC L 3.11  TLC % Predicted % 44  RV % Predicted % 42    Imaging: Ct Chest Wo Contrast  Result Date: 07/05/2018 CLINICAL DATA:  Follow-up lung nodule EXAM: CT CHEST WITHOUT CONTRAST TECHNIQUE: Multidetector CT imaging of the chest was performed following the standard protocol without IV contrast. COMPARISON:  01/15/2018, 12/23/2012 FINDINGS: Cardiovascular: Aortic atherosclerosis. Three-vessel coronary artery calcifications. Normal heart size. No pericardial effusion. Mediastinum/Nodes: No enlarged mediastinal, hilar, or axillary lymph nodes. Thyroid gland, trachea, and esophagus demonstrate no significant findings. Lungs/Pleura: There is severe biapical bullous emphysema with a superimposed pattern of moderate to severe pulmonary fibrosis in a basal predominant distribution featuring traction bronchiectasis, bronchiolectasis, and honeycombing. In the paramedian apical left upper lobe, there is an irregular nodule or consolidation that has increased in size and solidity compared to prior examination, measuring at least 1.6 x 1.0 cm (series 4, image 37). This change is best appreciated on coronal series (series 5, image 48, compared to prior examination series 9, image 40). No pleural effusion or pneumothorax. Upper Abdomen: No acute abnormality. Musculoskeletal: No chest wall mass or suspicious bone lesions identified. IMPRESSION: 1. In the paramedian  apical left upper lobe, there is an irregular nodule or consolidation that has increased in size and solidity compared to prior examination, measuring at least 1.6 x 1.0 cm (series 4, image 37). This change is best appreciated on coronal series (series 5, image 48, compared to prior examination series 9, image 40). This finding is concerning for malignancy in the high risk setting of pulmonary fibrosis, although may represent consolidating fibrosis. Consider PET-CT to evaluate for metabolic activity. 2. There is severe biapical bullous emphysema with a superimposed pattern of moderate to severe pulmonary fibrosis in a basal predominant distribution featuring traction bronchiectasis, bronchiolectasis, and honeycombing. 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 3.  Coronary artery disease and aortic atherosclerosis. Electronically Signed   By: Eddie Candle M.D.   On: 07/05/2018 08:30   Nm Pet Image Initial (pi) Skull Base To Thigh  Result Date: 07/11/2018 CLINICAL DATA:  Initial treatment strategy for enlarging left upper lobe pulmonary nodule. EXAM: NUCLEAR MEDICINE PET SKULL BASE TO THIGH TECHNIQUE: 7.2 mCi F-18 FDG was injected intravenously. Full-ring PET imaging was performed from the skull base to thigh after the radiotracer. CT data was obtained and used for attenuation correction and anatomic localization. Fasting blood glucose: 107 mg/dl COMPARISON:  07/04/2018 chest CT. FINDINGS: Mediastinal blood pool activity: SUV max 1.8 Liver activity: SUV max NA NECK: No hypermetabolic lymph nodes in the neck. Incidental CT findings: none CHEST: Irregular solid 1.8 x 1.2 cm medial apical left upper lobe pulmonary nodule is hypermetabolic  with max SUV 8.1 (series 8/image 21). There is superimposed patchy consolidation throughout apical left upper lobe, which also demonstrates hypermetabolism (for example max SUV 7.1 in the posterior apical left upper lobe  focus of consolidation), and which is new in the short interval since 07/04/2018 chest CT. There is a subsolid 1.2 cm nodular focus of consolidation in the right upper lobe with associated hypermetabolism with max SUV 2.9 (series 8/image 30), new since the recent 07/04/2018 chest CT. No additional hypermetabolic pulmonary nodules. There are a few hypermetabolic AP window and left prevascular lymph nodes. For example a 1.3 cm AP window node with max SUV 4.7 (series 4/image 71). Top-normal size 0.8 cm left prevascular node demonstrates max SUV 3.7 (series 4/image 63). No additional hypermetabolic mediastinal nodes. No hypermetabolic axillary or hilar nodes. Incidental CT findings: Coronary atherosclerosis. Atherosclerotic nonaneurysmal thoracic aorta. Severe centrilobular and paraseptal emphysema. Patchy confluent pulmonary fibrosis in both lungs, most prominent in the lower lobes. Prominent right-sided gynecomastia, asymmetric, unchanged. ABDOMEN/PELVIS: No abnormal hypermetabolic activity within the liver, pancreas, adrenal glands, or spleen. No hypermetabolic lymph nodes in the abdomen or pelvis. Incidental CT findings: Subcentimeter hypodense liver lesions are too small to characterize and are below PET resolution. Simple bilateral renal cysts, largest 1.9 cm in the lower right kidney. Atherosclerotic abdominal aorta with 3.8 cm infrarenal abdominal aortic aneurysm status post aorto bi-iliac stent graft repair. Moderate prostatomegaly. Marked left colonic diverticulosis. SKELETON: No focal hypermetabolic activity to suggest skeletal metastasis. Incidental CT findings: none IMPRESSION: 1. Irregular solid medial apical left upper lobe pulmonary nodule is hypermetabolic and worrisome for primary bronchogenic carcinoma. However, evaluation is complicated by the presence of superimposed hypermetabolic patchy consolidation throughout the apical left upper lobe, which is new in the short interval since the 07/04/2018  chest CT study, most suggestive of superimposed pneumonia. 2. Hypermetabolic AP window and left prevascular lymphadenopathy, suspicious for metastatic adenopathy, although reactive adenopathy not excluded given the suspected superimposed pneumonia. 3. Mild hypermetabolism associated with a small subsolid right upper lobe pulmonary nodule, which is new since the recent 07/04/2018 chest CT study, more likely inflammatory. Close chest CT follow-up recommended in 3 months. 4. No evidence of hypermetabolic extrathoracic or skeletal metastatic disease. 5. Spectrum of findings suggestive of fibrotic interstitial lung disease, better characterized on prior diagnostic chest CT studies. 6. Aortic Atherosclerosis (ICD10-I70.0) and Emphysema (ICD10-J43.9). Electronically Signed   By: Ilona Sorrel M.D.   On: 07/11/2018 16:39      Specialty Problems      Pulmonary Problems   ILD (interstitial lung disease) ([place])    CT high-resolution 01/15/2018- progression in basilar fibrosis, traction bronchiectasis, honeycombing and UIP pattern. Bullous emphysematous changes, new nodular lesion in the left upper lobe.  Enlarged pulmonary artery.  CT 07/04/2018-irregular nodule/consolidation in the left upper lobe with increase in size compared to previous.  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  02/15/2018 FVC 1.77 (46%), FEV1 1.49 (52%), F/F 84, TLC 44%, DLCO 33% Severe restriction, diffusion impairment.  Hypersensitivity panel 02/15/2018-negative Alpha-1 antitrypsin 02/15/2018-161, PIMM  CTD serologies 02/15/2018- ANA negative, myositis panel-negative  Ro, La, SCL 70-Negative CCP 108, rheumatoid factor 54       Bullous emphysema ([place])    CT 07/04/2018-irregular nodule/consolidation in the left upper lobe with increase in size compared to previous.  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  PFTs 02/15/2018 FVC 1.77 (46%), FEV1 1.49 (52%), F/F 84, TLC 44%, DLCO 33% Severe  restriction, diffusion impairment.       Chronic  respiratory failure with hypoxia (HCC)      Allergies  Allergen Reactions   Sulfa Antibiotics Itching and Rash   Ibuprofen     Gi upset per pt    Immunization History  Administered Date(s) Administered   Influenza, High Dose Seasonal PF 12/03/2017    Past Medical History:  Diagnosis Date   AAA (abdominal aortic aneurysm) (HCC)    AAA (abdominal aortic aneurysm) (HCC)    Anxiety    Arthritis    Asthma    Chronic back pain    buldging disc   COPD (chronic obstructive pulmonary disease) ([place])    Duoneb daily as well as Advair   Depression    after accident over 25yrs ago but not on any meds   Emphysema lung ([place])    GERD (gastroesophageal reflux disease)    takes Zantac daily   Hepatitis    Hx of Hep A   History of bronchitis 2008   History of colon polyps    History of gastric ulcer    History of shingles    Hyperlipidemia    takes Zocor daily   Hypertension    takes Cardura daily   Joint pain    Neuropathy    takes Gabapentin daily as needed   Peripheral vascular disease ([place])    Pneumonia 2008   Seizures ([place])    in 1992 only had one;was placed on meds but has been off over 67yrs ago   Shortness of breath    with exertion    Tobacco History: Social History   Tobacco Use  Smoking Status Former Smoker   Packs/day: 0.25   Years: 50.00   Pack years: 12.50   Types: Cigarettes   Quit date: 01/24/2007   Years since quitting: 11.5  Smokeless Tobacco Never Used  Tobacco Comment   average 3-4 cigs per week    Counseling given: Yes Comment: average 3-4 cigs per week   Continue to not smoke  Outpatient Encounter Medications as of 07/30/2018  Medication Sig   amLODipine (NORVASC) 5 MG tablet Take 5 mg by mouth daily.   Cholecalciferol (VITAMIN D3) 10 MCG (400 UNIT) tablet Take 400 Units by mouth daily.   famotidine (PEPCID) 10 MG tablet Take 10 mg by mouth daily as  needed for heartburn or indigestion.   Fluticasone-Umeclidin-Vilant (TRELEGY ELLIPTA) 100-62.5-25 MCG/INH AEPB Inhale 1 puff into the lungs daily.   ipratropium-albuterol (DUONEB) 0.5-2.5 (3) MG/3ML SOLN Take 3 mLs by nebulization 4 (four) times daily.    Lactobacillus (PROBIOTIC ACIDOPHILUS) CAPS Take 1 tablet by mouth daily.   PROAIR HFA 108 (90 BASE) MCG/ACT inhaler Inhale 2 puffs into the lungs 4 (four) times daily as needed for wheezing or shortness of breath.    simvastatin (ZOCOR) 20 MG tablet Take 20 mg by mouth at bedtime.    tamsulosin (FLOMAX) 0.4 MG CAPS capsule Take 0.4 mg by mouth daily.   [DISCONTINUED] predniSONE (DELTASONE) 10 MG tablet 4 tabs x's 3 days,3tabs x's 3days,2tabs x's 3 days,1 tab x's 3 days,then stop   benzonatate (TESSALON) 200 MG capsule Take 1 capsule (200 mg total) by mouth 3 (three) times daily as needed for cough.   celecoxib (CELEBREX) 200 MG capsule Take 200 mg by mouth daily.   hydroxychloroquine (PLAQUENIL) 200 MG tablet Take 400 mg by mouth daily.   [DISCONTINUED] levofloxacin (LEVAQUIN) 500 MG tablet Take 1 tablet (500 mg total) by mouth daily. (Patient not taking: Reported on 07/30/2018)   [DISCONTINUED] predniSONE (DELTASONE) 10 MG  tablet Take 1 tablet (10 mg total) by mouth daily with breakfast.   No facility-administered encounter medications on file as of 07/30/2018.      Review of Systems  Review of Systems  Constitutional: Positive for activity change, fatigue and unexpected weight change (pt feels related to OFEV). Negative for chills and fever.  HENT: Negative for postnasal drip, sinus pressure, sneezing and sore throat.   Eyes: Negative.   Respiratory: Positive for cough (productive with green sputum) and shortness of breath. Negative for wheezing.   Cardiovascular: Negative for chest pain and palpitations.  Gastrointestinal: Negative for diarrhea, nausea and vomiting.  Endocrine: Negative.   Musculoskeletal: Negative.   Skin:  Negative.   Neurological: Negative for dizziness and headaches.  Psychiatric/Behavioral: Negative.  Negative for dysphoric mood. The patient is not nervous/anxious.   All other systems reviewed and are negative.    Physical Exam  BP 134/68 (BP Location: Left Arm, Cuff Size: Normal)    Pulse 85    Temp 97.7 F (36.5 C) (Oral)    Ht 5\' 11"  (1.803 m)    Wt 147 lb 12.8 oz (67 kg)    SpO2 94%    BMI 20.61 kg/m   Wt Readings from Last 5 Encounters:  07/30/18 147 lb 12.8 oz (67 kg)  07/12/18 145 lb 9.6 oz (66 kg)  07/11/18 143 lb (64.9 kg)  02/15/18 151 lb (68.5 kg)  01/08/18 155 lb (70.3 kg)     Physical Exam  Constitutional: He is oriented to person, place, and time and well-developed, well-nourished, and in no distress. No distress.  Chronically ill elderly male  HENT:  Head: Normocephalic and atraumatic.  Right Ear: Hearing, tympanic membrane, external ear and ear canal normal.  Left Ear: Hearing, tympanic membrane, external ear and ear canal normal.  Nose: Nose normal.  Mouth/Throat: Uvula is midline and oropharynx is clear and moist. No oropharyngeal exudate.  Eyes: Pupils are equal, round, and reactive to light.  Neck: Normal range of motion. Neck supple.  Cardiovascular: Normal rate. Frequent extrasystoles are present. Exam reveals distant heart sounds.  Pulses:      Radial pulses are 2+ on the right side and 2+ on the left side.  Pulmonary/Chest: Effort normal. No accessory muscle usage. No respiratory distress. He has no decreased breath sounds. He has wheezes. He has no rhonchi. He has rales (bibasilar crackles ).  Musculoskeletal: Normal range of motion.        General: No edema.  Lymphadenopathy:    He has no cervical adenopathy.  Neurological: He is alert and oriented to person, place, and time. Gait normal.  Able to complete 3 laps in office, did qualify for 2 L of O2  Skin: Skin is warm and dry. He is not diaphoretic. No erythema.  Psychiatric: Mood, memory, affect  and judgment normal.  Nursing note and vitals reviewed.     Lab Results:  CBC    Component Value Date/Time   WBC 6.7 10/03/2013 0425   RBC 4.05 (L) 10/03/2013 0425   HGB 11.8 (L) 10/03/2013 0425   HCT 35.1 (L) 10/03/2013 0425   PLT 126 (L) 10/03/2013 0425   MCV 86.7 10/03/2013 0425   MCH 29.1 10/03/2013 0425   MCHC 33.6 10/03/2013 0425   RDW 14.6 10/03/2013 0425    BMET    Component Value Date/Time   NA 138 10/03/2013 0425   K 4.1 10/03/2013 0425   CL 104 10/03/2013 0425   CO2 23 10/03/2013 0425  GLUCOSE 87 10/03/2013 0425   BUN 15 10/03/2013 0425   CREATININE 1.02 10/03/2013 0425   CREATININE 0.95 09/17/2013 1407   CALCIUM 8.4 10/03/2013 0425   GFRNONAA 73 (L) 10/03/2013 0425   GFRAA 85 (L) 10/03/2013 0425    BNP No results found for: BNP  ProBNP No results found for: PROBNP    Assessment & Plan:   ILD (interstitial lung disease) ([place]) 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 Previously intolerant of Ofev but is willing to try at a lower dose Elevated rheumatoid factor and CCP, other see TD serologies are negative Expiratory wheezes on exam today as well as basilar crackles Walk in office today patient required 2 L with physical exertion  Plan: Restart 60fev 150 mg daily Continue 10 mg prednisone managed by rheumatology Start oxygen 2 L with physical exertion Repeat CT of chest in September/2020 Echocardiogram to be completed 4-week follow-up with our office Continue follow-up with rheumatology, we will request records again today  Tessalon Perles for management of cough  Bullous emphysema ([place]) Assessment: June/2020 CT shows severe biapical bullous emphysema Former smoker January/2020 pulmonary function test shows restriction and severe diffusion capacity Walk today in office patient required oxygen 2 L Patient is maintained on Trelegy Ellipta as well as prednisone 10 mg daily managed by  rheumatology  Plan: Start 2 L of O2 with physical exertion Continue Trelegy Ellipta Continue prednisone 10 mg daily  Chronic respiratory failure with hypoxia ([place]) Assessment: Patient with severe bullous emphysema as well as RA ILD Walk in office today patient required 2 L of O2 with physical exertion  Plan: Start 2 L of O2 with physical exertion May need to consider overnight oximetry in the future to evaluate for nocturnal hypoxemia    Return in about 4 weeks (around 08/27/2018), or if symptoms worsen or fail to improve, for Follow up with Dr. Vaughan Browner.   Lauraine Rinne, NP 07/30/2018   This appointment was 43 minutes long with over 50% of the time in direct face-to-face patient care, assessment, plan of care, and follow-up.

## 2018-07-30 ENCOUNTER — Telehealth: Payer: Self-pay | Admitting: Pulmonary Disease

## 2018-07-30 ENCOUNTER — Encounter: Payer: Self-pay | Admitting: Pulmonary Disease

## 2018-07-30 ENCOUNTER — Ambulatory Visit: Payer: Medicare Other | Admitting: Pulmonary Disease

## 2018-07-30 ENCOUNTER — Other Ambulatory Visit: Payer: Self-pay

## 2018-07-30 VITALS — BP 134/68 | HR 85 | Temp 97.7°F | Ht 71.0 in | Wt 147.8 lb

## 2018-07-30 DIAGNOSIS — J849 Interstitial pulmonary disease, unspecified: Secondary | ICD-10-CM

## 2018-07-30 DIAGNOSIS — J439 Emphysema, unspecified: Secondary | ICD-10-CM | POA: Insufficient documentation

## 2018-07-30 DIAGNOSIS — J9611 Chronic respiratory failure with hypoxia: Secondary | ICD-10-CM | POA: Insufficient documentation

## 2018-07-30 MED ORDER — BENZONATATE 200 MG PO CAPS: 200.0000 mg | ORAL_CAPSULE | Freq: Three times a day (TID) | ORAL | 1 refills | Status: DC | PRN

## 2018-07-30 MED ORDER — PREDNISONE 10 MG PO TABS: 10.0000 mg | ORAL_TABLET | Freq: Every day | ORAL | 1 refills | Status: DC

## 2018-07-30 NOTE — Patient Instructions (Addendum)
Walk in office   Restart OFEV 150 mg daily  Continue Trelegy Ellipta  >>> 1 puff daily in the morning >>>rinse mouth out after use  >>> This inhaler contains 3 medications that help manage her respiratory status, contact our office if you cannot afford this medication or unable to remain on this medication  We will request records from Dr. Daria Pastures and Dr. Luan Pulling regarding your treatment plans  Once your sputum culture results have resulted Dr. Vaughan Browner will follow up with you if you need additional antibiotics  We will order an Echocardiogram to evaluate your heart functioning  We will plan to repeat a high-resolution CT of your chest in September/2020  Return in about 4 weeks (around 08/27/2018), or if symptoms worsen or fail to improve, for Follow up with Dr. Vaughan Browner.   >>> if nothing available then place with [place] (COVID-19) Are you at risk?  Are you at risk for the Coronavirus (COVID-19)?  To be considered HIGH RISK for Coronavirus (COVID-19), you have to meet the following criteria:  . Traveled to [place], [place], [place], [place] or [place]; or in the [place] to [place], [place], [place], or [place]; and have fever, cough, and shortness of breath within the last 2 weeks of travel OR . Been in close contact with a person diagnosed with COVID-19 within the last 2 weeks and have fever, cough, and shortness of breath . IF YOU DO NOT MEET THESE CRITERIA, YOU ARE CONSIDERED LOW RISK FOR COVID-19.  What to do if you are HIGH RISK for COVID-19?  Marland Kitchen If you are having a medical emergency, call 911. . Seek medical care right away. Before you go to a doctor's office, urgent care or emergency department, call ahead and tell them about your recent travel, contact with someone diagnosed with COVID-19, and your symptoms. You should receive instructions from your physician's office regarding next steps of care.  . When you arrive at healthcare provider,  tell the healthcare staff immediately you have returned from visiting [place], [place], [place], [place] or [place]; or traveled in the [place] to [place], [place], [place], or [place]; in the last two weeks or you have been in close contact with a person diagnosed with COVID-19 in the last 2 weeks.   . Tell the health care staff about your symptoms: fever, cough and shortness of breath. . After you have been seen by a medical provider, you will be either: o Tested for (COVID-19) and discharged home on quarantine except to seek medical care if symptoms worsen, and asked to  - Stay home and avoid contact with others until you get your results (4-5 days)  - Avoid travel on public transportation if possible (such as bus, train, or airplane) or o Sent to the Emergency Department by EMS for evaluation, COVID-19 testing, and possible admission depending on your condition and test results.  What to do if you are LOW RISK for COVID-19?  Reduce your risk of any infection by using the same precautions used for avoiding the common cold or flu:  Marland Kitchen Wash your hands often with soap and warm water for at least 20 seconds.  If soap and water are not readily available, use an alcohol-based hand sanitizer with at least 60% alcohol.  . If coughing or sneezing, cover your mouth and nose by coughing or sneezing into the elbow areas of your shirt or coat, into a tissue or  into your sleeve (not your hands). . Avoid shaking hands with others and consider head nods or verbal greetings only. . Avoid touching your eyes, nose, or mouth with unwashed hands.  . Avoid close contact with people who are sick. . Avoid places or events with large numbers of people in one location, like concerts or sporting events. . Carefully consider travel plans you have or are making. . If you are planning any travel outside or inside the [place], visit the CDC's Travelers' Health webpage for the latest health notices. . If you have some  symptoms but not all symptoms, continue to monitor at home and seek medical attention if your symptoms worsen. . If you are having a medical emergency, call 911.   [place] / e-Visit: [email or web address]         [facility]: [facility]: [phone]                   [facility]: [phone]           It is flu season:   >>> Best ways to protect herself from the flu: Receive the yearly flu vaccine, practice good hand hygiene washing with soap and also using hand sanitizer when available, eat a nutritious meals, get adequate rest, hydrate appropriately   Please contact the office if your symptoms worsen or you have concerns that you are not improving.   Thank you for choosing [facility] for your healthcare, and for allowing [place] to partner with you on your healthcare journey. I am thankful to be able to provide care to you today.   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  PET scan showed areas of hypermetabolic activity that was suggestive of potentially the pneumonia that he had.  This is why we will follow again with a close CT follow-up in  September/2020.  What is really wrong with me? Answer: There is a mixture of contributing factors to his shortness of breath.  Patient has severe emphysema which affects how well he compresses oxygen.  Patient also has scarring and fibrosis on his lungs that we believe is due to his rheumatoid arthritis which also affects his ability to breathe.  We are also ordering an echocardiogram to evaluate his heart functioning.  Why cannot get rid of this cough Answer: This is likely due to the interstitial lung disease from the rheumatoid arthritis, we will send in a prescription for [facility] which she can use as needed every 8 hours to help with his breathing  Do I have cancer? Answer: As of right now we do not have a diagnosis of this, potentially there could  be a malignancy or a cancer behind the pneumonia that we recently treated.  This is why we will repeat a CT and a close follow-up in September/2020 to watch out for this.  If patient starts to cough up blood or lose weight rapidly or loss of appetite please contact our office and we may need to move the CT up sooner.  Ideally we do the CT in 3 months to allow the pneumonia time to clear after being treated with the Levaquin.  If I am really so sick what treatment do you recommend for me that would help Answer: Continue all current therapies at this time.  Patient did qualify for oxygen today with physical exertion.  If oxygen levels drop below 88% he needs to apply his oxygen.  If patient is starting to have increased fatigue, fevers, chills or worsening symptoms please contact our office immediately or proceed to an emergency room for further evaluation.  If you go to a [facility] facility emergency room please let him know that you are a patient of Dr. Vaughan Browner.

## 2018-07-30 NOTE — Assessment & Plan Note (Signed)
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

## 2018-07-30 NOTE — Addendum Note (Signed)
Addended by: Parke Poisson E on: 07/30/2018 10:58 AM   Modules accepted: Orders

## 2018-07-30 NOTE — Assessment & Plan Note (Signed)
Assessment: June/2020 CT shows severe biapical bullous emphysema Former smoker January/2020 pulmonary function test shows restriction and severe diffusion capacity Walk today in office patient required oxygen 2 L Patient is maintained on Trelegy Ellipta as well as prednisone 10 mg daily managed by rheumatology  Plan: Start 2 L of O2 with physical exertion Continue Trelegy Ellipta Continue prednisone 10 mg daily

## 2018-07-30 NOTE — Telephone Encounter (Signed)
Call returned to [place] with [facility], she states they need the actual oxygen saturations report. Report printed and sent. Fax [phone]. Nothing further needed at this time.

## 2018-07-30 NOTE — Assessment & Plan Note (Addendum)
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 Previously intolerant of Ofev but is willing to try at a lower dose Elevated rheumatoid factor and CCP, other see TD serologies are negative Expiratory wheezes on exam today as well as basilar crackles Walk in office today patient required 2 L with physical exertion  Plan: Restart 13fev 150 mg daily Continue 10 mg prednisone managed by rheumatology Start oxygen 2 L with physical exertion Repeat CT of chest in September/2020 Echocardiogram to be completed 4-week follow-up with our office Continue follow-up with rheumatology, we will request records again today  Tessalon Perles for management of cough

## 2018-07-30 NOTE — Telephone Encounter (Signed)
Spoke with Cecille Rubin. She stated that the information that sent over to [place] today does not qualify the patient for O2. Advised her that the patient had been walked and qualified for O2 today in the office. Offered to send a copy of the ambulatory pulse oximetry report to her. This was sent to [phone].   She stated that she will take a look at the information and see what she will be able to do. Advised her to call us back if she needed any additional information.

## 2018-08-01 DIAGNOSIS — J849 Interstitial pulmonary disease, unspecified: Secondary | ICD-10-CM | POA: Diagnosis not present

## 2018-08-01 DIAGNOSIS — J439 Emphysema, unspecified: Secondary | ICD-10-CM | POA: Diagnosis not present

## 2018-08-01 DIAGNOSIS — J9611 Chronic respiratory failure with hypoxia: Secondary | ICD-10-CM | POA: Diagnosis not present

## 2018-08-02 ENCOUNTER — Telehealth: Payer: Self-pay | Admitting: Pulmonary Disease

## 2018-08-02 LAB — RESPIRATORY CULTURE OR RESPIRATORY AND SPUTUM CULTURE
MICRO NUMBER:: 641463
RESULT:: NORMAL
SPECIMEN QUALITY:: ADEQUATE

## 2018-08-02 NOTE — Telephone Encounter (Signed)
08/02/2018 1547  Records received from rheumatologist as well as primary care.  These records need to be placed in Dr. Matilde Bash lookup folder.  After that they need to be scanned.  Routing to Dr. Vaughan Browner as Juluis Rainier.  Wyn Quaker, FNP

## 2018-08-02 NOTE — Telephone Encounter (Signed)
Called and spoke with [name], Patients Wife.  [name] stated they were told by [facility], that Patient did not qualify for O2, because his sats were 94% on RA.  Per previous phone notes, O2 qualifying walk results were faxed to [facility], 07/30/18.  I called [facility], spoke with Lor,  and was told qualifying O2 results were received. Cecille Rubin stated Patient was on list to receive O2 this afternoon. I called [name], Patient's Wife, explained Patient was on the list to have O2 delivered by [name] this afternoon. Understanding stated.  Nothing further at this time.

## 2018-08-05 ENCOUNTER — Ambulatory Visit (HOSPITAL_COMMUNITY)
Admission: RE | Admit: 2018-08-05 | Discharge: 2018-08-05 | Disposition: A | Discharge status: Home / Self Care | Payer: Medicare Other | Source: Ambulatory Visit | Attending: Pulmonary Disease | Admitting: Pulmonary Disease

## 2018-08-05 ENCOUNTER — Other Ambulatory Visit: Payer: Self-pay

## 2018-08-05 DIAGNOSIS — I868 Varicose veins of other specified sites: Secondary | ICD-10-CM | POA: Insufficient documentation

## 2018-08-05 DIAGNOSIS — J439 Emphysema, unspecified: Secondary | ICD-10-CM | POA: Insufficient documentation

## 2018-08-05 DIAGNOSIS — I6529 Occlusion and stenosis of unspecified carotid artery: Secondary | ICD-10-CM | POA: Insufficient documentation

## 2018-08-05 DIAGNOSIS — J849 Interstitial pulmonary disease, unspecified: Secondary | ICD-10-CM | POA: Diagnosis not present

## 2018-08-05 DIAGNOSIS — J9611 Chronic respiratory failure with hypoxia: Secondary | ICD-10-CM | POA: Diagnosis not present

## 2018-08-05 DIAGNOSIS — I119 Hypertensive heart disease without heart failure: Secondary | ICD-10-CM | POA: Diagnosis not present

## 2018-08-05 DIAGNOSIS — I081 Rheumatic disorders of both mitral and tricuspid valves: Secondary | ICD-10-CM | POA: Insufficient documentation

## 2018-08-05 NOTE — Progress Notes (Signed)
*  PRELIMINARY RESULTS* Echocardiogram 2D Echocardiogram has been performed.  [name] 08/05/2018, 9:12 AM

## 2018-08-08 ENCOUNTER — Telehealth: Payer: Self-pay | Admitting: Pulmonary Disease

## 2018-08-08 NOTE — Telephone Encounter (Signed)
This is news to me.  The patient did not mention this at all at the office visit.  Based off this information I would assume that we need to start new enrollment forms for the patient.  He is on the 150 mg daily dose, due to him not tolerating 150 mg twice daily.  We will need to start this paperwork on 08/09/2018 and update the patient's family.  Routing this to triage for follow up as Keane Scrape is not working with me regularly.  I am not sure if Randall Hiss is ever had to fill out paperwork for anti-fibrotic's.  I will [place].Wyn Quaker, FNP

## 2018-08-08 NOTE — Telephone Encounter (Signed)
Per Brian's note of OV that day nothing mentioned of restart to paperwork . I will forward this to him and Jess as she was working with him that day to follow up on.   Aaron Edelman please advise

## 2018-08-08 NOTE — Telephone Encounter (Signed)
Spoke with pt's spouse, states that she was never given restart paperwork for pt's Ofev at last [place].  Pt wants to know if she needs to fill this out, and if she does not would like a status update on the paperwork.   Lauren please advise if we need to send new enrollment forms to pt, or if this has been started in clinic.  Thanks!

## 2018-08-09 NOTE — Telephone Encounter (Signed)
Instructions from OV with Wyn Quaker, NP    Return in about 4 weeks (around 08/27/2018), or if symptoms worsen or fail to improve, for Follow up with Dr. Vaughan Browner. Walk in office   Restart OFEV 150 mg daily  Continue Trelegy Ellipta  >>> 1 puff daily in the morning >>>rinse mouth out after use  >>> This inhaler contains 3 medications that help manage her respiratory status, contact our office if you cannot afford this medication or unable to remain on this medication  We will request records from Dr. Daria Pastures and Dr. Luan Pulling regarding your treatment plans  Once your sputum culture results have resulted Dr. Vaughan Browner will follow up with you if you need additional antibiotics  We will order an Echocardiogram to evaluate your heart functioning  We will plan to repeat a high-resolution CT of your chest in September/2020  Return in about 4 weeks (around 08/27/2018), or if symptoms worsen or fail to improve, for Follow up with Dr. Vaughan Browner.   >>> if nothing available then place with Wyn Quaker FNP      With pt already having paperwork filled out previously, the specialty pharmacy might just need a new Rx. I will call the pharmacy to get more info from them.  Called [facility] at [phone] and spoke to [place] with the [facility] team. Asked Verdis Frederickson if anything was needed on our end in regards to pt being able to get another shipment of the [facility] and per Verdis Frederickson, it looked like there was nothing that should be needed that pt could be able to call and request a refill. Verdis Frederickson stated she was going to reach out to the pharmacy staff to see if there was anything they needed. Spoke with Jarrett Soho, pharmacist who wanted to make sure that pt has restarted and I told her at [place] 07/30/2018, pt was told to restart the OFEV 150. Jarrett Soho made a note in pt's file that he had restarted.  Called and spoke with pt's wife [name] letting her know the info I found out from the specialty pharmacy that all they need to do is  contact the pharmacy and request a refill of the med. [name] verbalized understanding. Routing to [place] as an Pharmacist, hospital. Nothing further needed.

## 2018-08-09 NOTE — Telephone Encounter (Signed)
Thank you for the follow-up.  Thank you for working with the patient.Wyn Quaker, FNP

## 2018-08-16 ENCOUNTER — Telehealth: Payer: Self-pay | Admitting: Pulmonary Disease

## 2018-08-16 DIAGNOSIS — J849 Interstitial pulmonary disease, unspecified: Secondary | ICD-10-CM

## 2018-08-16 MED ORDER — BENZONATATE 200 MG PO CAPS: 200.0000 mg | ORAL_CAPSULE | Freq: Three times a day (TID) | ORAL | 1 refills | Status: DC | PRN

## 2018-08-16 MED ORDER — DOXYCYCLINE HYCLATE 100 MG PO TABS: 100.0000 mg | ORAL_TABLET | Freq: Two times a day (BID) | ORAL | 0 refills | Status: DC

## 2018-08-16 MED ORDER — PREDNISONE 10 MG PO TABS: ORAL_TABLET | ORAL | 0 refills | Status: DC

## 2018-08-16 NOTE — Telephone Encounter (Signed)
Spoke with patient and his wife [name]. She stated that he is wheezing. Advised of all of the medications and the instructions, they verbalized understanding.   Medications have been called in.   Nothing further needed at time of call.

## 2018-08-16 NOTE — Telephone Encounter (Signed)
[name] states patient still has cough at night.

## 2018-08-16 NOTE — Telephone Encounter (Signed)
Can offer:  Doxycycline >>> 1 100 mg tablet every 12 hours for 7 days >>>take with food  >>>wear sunscreen   Please place the order  If patient is having any aspect of wheezing can offer:  Prednisone 10mg  tablet  >>>4 tabs for 2 days, then 3 tabs for 2 days, 2 tabs for 2 days, then 1 tab for 2 days, then stop >>>take with food  >>>take in the morning   Please place order  Wyn Quaker, FNP

## 2018-08-16 NOTE — Telephone Encounter (Signed)
Primary Pulmonologist: Dr. Vaughan Browner  Last office visit and with whom: 07/30/18 with Aaron Edelman What do we see them for (pulmonary problems): ILD Last OV assessment/plan: Walk in office   Restart OFEV 150 mg daily  Continue Trelegy Ellipta  >>> 1 puff daily in the morning >>>rinse mouth out after use  >>> This inhaler contains 3 medications that help manage her respiratory status, contact our office if you cannot afford this medication or unable to remain on this medication  We will request records from Dr. Daria Pastures and Dr. Luan Pulling regarding your treatment plans  Once your sputum culture results have resulted Dr. Vaughan Browner will follow up with you if you need additional antibiotics  We will order an Echocardiogram to evaluate your heart functioning  We will plan to repeat a high-resolution CT of your chest in September/2020  Return in about 4 weeks (around 08/27/2018), or if symptoms worsen or fail to improve, for Follow up with Dr. Vaughan Browner.   >>> if nothing available then place with Wyn Quaker FNP   Was appointment offered to patient (explain)? Patient wanted recommendations due to time of day.    Reason for call: Spoke with patient. He stated that he is still having issues with his cough at night. It is a productive cough with dark yellow, greenish phlegm. He is unable to sleep at night due to the cough. During the day, he still has a cough but it is no where near as bad as at night. Slight increased in SOB at night as well. He denied any fevers, body aches or being around anyone with COVID. He has been using his Tessalon perles and needs a refill.   He wants to know if he needs an antibiotic.   Pharmacy is [facility].   Aaron Edelman, please advise since you were the last one to see him. Thanks!

## 2018-08-26 NOTE — Progress Notes (Signed)
Virtual Visit via Telephone Note  I connected with [name]. on 08/27/18 at 10:00 AM EDT by telephone and verified that I am speaking with the correct person using two identifiers.  Location: Patient: Home Provider: [facility] - [address], [place], [place], [place] [address]   I discussed the limitations, risks, security and privacy concerns of performing an evaluation and management service by telephone and the availability of in person appointments. I also discussed with the patient that there may be a patient responsible charge related to this service. The patient expressed understanding and agreed to proceed.  Patient consented to consult via telephone: Yes People present and their role in pt care: Pt     History of Present Illness: 73 year old male former smoker followed in our office for COPD, RA ILD, emphysema  PMH: Rheumatoid arthritis (followed by Dr. Jeannie Fend medical rheumatology), hepatitis, hyperlipidemia Smoker/ Smoking History: Former smoker.  Quit 2009. Maintenance: Trelegy Ellipta, OFEV 150mg  QD, Prednisone 10mg  Pt of: Dr. 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  No mold, hot tub, Jacuzzi, humidifier Smoking history: 12.5-pack-year history.  Quit in 2009 Travel history: Previously lived in [place].  No significant recent travel Relevant family history: No significant family history of lung disease.  Chief complaint: Cough   73 year old male former smoker followed in our office for COPD and ILD.  Patient completing a tele-visit with our office today and reporting that his cough has improved slightly but is still quite present.  Patient reports the sputum color has improved and has gone from green to yellow.  Patient also feels he is having less sputum than before.  Patient still is having copious amounts of sputum at night  when he lays flat.  He also reports today that he has been taken off of his hydroxychloroquine from rheumatology and switched to azathioprine 50mg  BID.  Patient reports that he is still having wheezing with physical exertion as well was when he lays flat.  Overall patient feels that he is feeling better since being treated with doxycycline but does not feel that he is back to his baseline yet.  Patient has a recent sputum culture results that was negative for bacterial growth.  At last office visit patient was started on 2 L of O2 with physical exertion.  Patient reporting today that he has been using this intermittently as he feels he needs it.  He reports that when he checks his oxygen levels prior to putting the oxygen on his oxygenation runs around 75%.  Patient did not understand that he was post to remain on oxygen.  MMRC - Breathlessness Score 2 - on level ground, I walk slower than people of the same age because of breathlessness, or have to stop for breathe when walking to my own pace   Observations/Objective:  08/26/2018 - temp - 100 08/27/2018 - temp - 98.6  Imaging: CT abdomen pelvis 10/27/2013- visualized lung bases show peripheral honeycombing with mild fibrosis, reticulation, architectural distortion.    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 Bullous emphysematous changes, new nodular lesion in the left upper lobe.  Enlarged pulmonary artery.  CT 07/04/2018-irregular nodule/consolidation in the left upper lobe with increase in size compared to previous.  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  PET scan 07/11/2018- left upper lobe irregular pulmonary nodule is hypermetabolic.  There is also superimposed patchy consolidation throughout the apical left upper lobe.  Hypermetabolic AP window and left prevascular lymphadenopathy.   PFTs 02/15/2018 FVC 1.77 (46%), FEV1 1.49 (52%), F/F 84, TLC  44%, DLCO 33% Severe restriction, diffusion impairment.  FENO 02/15/2018- 19  Labs Hypersensitivity panel 02/15/2018-negative Alpha-1 antitrypsin 02/15/2018-161, PIMM  CTD serologies 02/15/2018- ANA negative, myositis panel-negative  Ro, La, SCL 70-Negative CCP 108, rheumatoid factor 54  07/16/2018-SARS-CoV-2-not detected  07/30/2018-respiratory sputum culture- growth of normal oropharyngeal flora  08/05/2018-echocardiogram-LV ejection fraction 60 to 65%, right ventricle with normal systolic function,, no increase in right ventricular wall thickness  SIX MIN WALK 07/30/2018 02/15/2018  Supplimental Oxygen during Test? (L/min) Yes No  O2 Flow Rate 2 -  Type Continuous -  Tech Comments: pt walked a slow-moderate pace.  pt was obviously dyspneic after 1st lap - returned to room and applied 2L continuous.  after 3rd lap with O2 patient was fatigued but noted this was the farthest he had walked 'in a long time' Pt. completed 1 1/2 laps, but had to stop due to dizziness and legs "feeling like noodles". Did not desat.      Assessment and Plan:  Chronic respiratory failure with hypoxia (HCC) Assessment: Patient with severe bullous emphysema as well as RA ILD Walk in office on 07/30/2018 revealed patient needs 2 L of O2 with physical exertion  Plan: Restart wearing 2 L of O2 with physical exertion Monitor oxygen levels to ensure they are greater than 88% Overnight oximetry ordered today  Bullous emphysema (HCC) Assessment: June/2020 CT chest shows severe biapical bullous emphysema Former smoker January/2020 pulmonary function test shows restriction as well as severe diffusion capacity Walk in office on 07/30/2018 showed the patient required 2 L of O2 with physical exertion Maintained on Trelegy Ellipta Sputum culture in July/2020 showed no bacterial growth  Plan: Continue Trelegy Ellipta Levaquin today Prednisone taper today Close follow-up with our office in 2 weeks with a chest x-ray to  further monitor symptoms   ILD (interstitial lung disease) ([place]) Plan: Continue 0FEV 150 mg daily Continue 10 mg of prednisone managed by rheumatology Restart wearing 2 L of O2 with physical exertion Plan to repeat CT of chest in September/2020 2-week follow-up with our office Continue to follow-up with rheumatology  Rheumatoid arthritis [facility]) Plan: Continue follow-up with rheumatology   Follow Up Instructions:  Return in about 2 weeks (around 09/10/2018), or if symptoms worsen or fail to improve, for Follow up with Dr. Vaughan Browner, Follow up with Wyn Quaker FNP-C.   I discussed the assessment and treatment plan with the patient. The patient was provided an opportunity to ask questions and all were answered. The patient agreed with the plan and demonstrated an understanding of the instructions.   The patient was advised to call back or seek an in-person evaluation if the symptoms worsen or if the condition fails to improve as anticipated.  I provided 23 minutes of non-face-to-face time during this encounter.   Lauraine Rinne, NP

## 2018-08-27 ENCOUNTER — Other Ambulatory Visit: Payer: Self-pay

## 2018-08-27 ENCOUNTER — Encounter: Payer: Self-pay | Admitting: Pulmonary Disease

## 2018-08-27 ENCOUNTER — Ambulatory Visit (INDEPENDENT_AMBULATORY_CARE_PROVIDER_SITE_OTHER): Payer: Medicare Other | Admitting: Pulmonary Disease

## 2018-08-27 DIAGNOSIS — J439 Emphysema, unspecified: Secondary | ICD-10-CM | POA: Diagnosis not present

## 2018-08-27 DIAGNOSIS — J9611 Chronic respiratory failure with hypoxia: Secondary | ICD-10-CM | POA: Diagnosis not present

## 2018-08-27 DIAGNOSIS — J849 Interstitial pulmonary disease, unspecified: Secondary | ICD-10-CM | POA: Diagnosis not present

## 2018-08-27 DIAGNOSIS — M069 Rheumatoid arthritis, unspecified: Secondary | ICD-10-CM | POA: Diagnosis not present

## 2018-08-27 MED ORDER — LEVOFLOXACIN 500 MG PO TABS: 500.0000 mg | ORAL_TABLET | Freq: Every day | ORAL | 0 refills | Status: DC

## 2018-08-27 MED ORDER — PREDNISONE 10 MG PO TABS: ORAL_TABLET | ORAL | 0 refills | Status: DC

## 2018-08-27 NOTE — Patient Instructions (Addendum)
Levaquin 500mg  tablet >>> Take 1 500 mg tablet daily for the next 7 days >>> Take with food >>> start probiotic for good gut health >>>avoid rigorous exercise for next 2 weeks   Prednisone 10mg  tablet  >>>4 tabs for 2 days, then 3 tabs for 2 days, 2 tabs for 2 days, then 1 tab for 2 days, then stop >>>take with food  >>>take in the morning   Ordered a overnight oximetry   Continue OFEV 150 mg daily  Continue Trelegy Ellipta  >>> 1 puff daily in the morning >>>rinse mouth out after use  >>> This inhaler contains 3 medications that help manage her respiratory status, contact our office if you cannot afford this medication or unable to remain on this medication  We will plan to repeat a high-resolution CT of your chest in September/2020  Return in about 2 weeks (around 09/10/2018), or if symptoms worsen or fail to improve, for Follow up with Dr. Vaughan Browner, Follow up with Wyn Quaker FNP-C.  Coronavirus (COVID-19) Are you at risk?  Are you at risk for the Coronavirus (COVID-19)?  To be considered HIGH RISK for Coronavirus (COVID-19), you have to meet the following criteria:  . Traveled to [place], [place], [place], [place] or [place]; or in the [place] to [place], [place], [place], or [place]; and have fever, cough, and shortness of breath within the last 2 weeks of travel OR . Been in close contact with a person diagnosed with COVID-19 within the last 2 weeks and have fever, cough, and shortness of breath . IF YOU DO NOT MEET THESE CRITERIA, YOU ARE CONSIDERED LOW RISK FOR COVID-19.  What to do if you are HIGH RISK for COVID-19?  Marland Kitchen If you are having a medical emergency, call 911. . Seek medical care right away. Before you go to a doctor's office, urgent care or emergency department, call ahead and tell them about your recent travel, contact with someone diagnosed with COVID-19, and your symptoms. You should receive instructions from your physician's office regarding next  steps of care.  . When you arrive at healthcare provider, tell the healthcare staff immediately you have returned from visiting [place], [place], [place], [place] or [place]; or traveled in the [place] to [place], [place], [place], or [place]; in the last two weeks or you have been in close contact with a person diagnosed with COVID-19 in the last 2 weeks.   . Tell the health care staff about your symptoms: fever, cough and shortness of breath. . After you have been seen by a medical provider, you will be either: o Tested for (COVID-19) and discharged home on quarantine except to seek medical care if symptoms worsen, and asked to  - Stay home and avoid contact with others until you get your results (4-5 days)  - Avoid travel on public transportation if possible (such as bus, train, or airplane) or o Sent to the Emergency Department by EMS for evaluation, COVID-19 testing, and possible admission depending on your condition and test results.  What to do if you are LOW RISK for COVID-19?  Reduce your risk of any infection by using the same precautions used for avoiding the common cold or flu:  Marland Kitchen Wash your hands often with soap and warm water for at least 20 seconds.  If soap and water are not readily available, use an alcohol-based hand sanitizer with at least 60% alcohol.  . If coughing or sneezing, cover your mouth and  nose by coughing or sneezing into the elbow areas of your shirt or coat, into a tissue or into your sleeve (not your hands). . Avoid shaking hands with others and consider head nods or verbal greetings only. . Avoid touching your eyes, nose, or mouth with unwashed hands.  . Avoid close contact with people who are sick. . Avoid places or events with large numbers of people in one location, like concerts or sporting events. . Carefully consider travel plans you have or are making. . If you are planning any travel outside or inside the [place], visit the CDC's Travelers' Health  webpage for the latest health notices. . If you have some symptoms but not all symptoms, continue to monitor at home and seek medical attention if your symptoms worsen. . If you are having a medical emergency, call 911.   [place] / e-Visit: [email or web address]         [facility]: [facility]: [phone]                   [facility]: [phone]           It is flu season:   >>> Best ways to protect herself from the flu: Receive the yearly flu vaccine, practice good hand hygiene washing with soap and also using hand sanitizer when available, eat a nutritious meals, get adequate rest, hydrate appropriately   Please contact the office if your symptoms worsen or you have concerns that you are not improving.   Thank you for choosing [facility] for your healthcare, and for allowing [place] to partner with you on your healthcare journey. I am thankful to be able to provide care to you today.   Wyn Quaker FNP-C

## 2018-08-27 NOTE — Assessment & Plan Note (Signed)
Plan: Continue 0FEV 150 mg daily Continue 10 mg of prednisone managed by rheumatology Restart wearing 2 L of O2 with physical exertion Plan to repeat CT of chest in September/2020 2-week follow-up with our office Continue to follow-up with rheumatology

## 2018-08-27 NOTE — Assessment & Plan Note (Signed)
Plan: Continue follow-up with rheumatology 

## 2018-08-27 NOTE — Assessment & Plan Note (Signed)
Assessment: Patient with severe bullous emphysema as well as RA ILD Walk in office on 07/30/2018 revealed patient needs 2 L of O2 with physical exertion  Plan: Restart wearing 2 L of O2 with physical exertion Monitor oxygen levels to ensure they are greater than 88% Overnight oximetry ordered today

## 2018-08-27 NOTE — Assessment & Plan Note (Signed)
Assessment: June/2020 CT chest shows severe biapical bullous emphysema Former smoker January/2020 pulmonary function test shows restriction as well as severe diffusion capacity Walk in office on 07/30/2018 showed the patient required 2 L of O2 with physical exertion Maintained on Trelegy Ellipta Sputum culture in July/2020 showed no bacterial growth  Plan: Continue Trelegy Ellipta Levaquin today Prednisone taper today Close follow-up with our office in 2 weeks with a chest x-ray to further monitor symptoms

## 2018-08-30 ENCOUNTER — Encounter: Payer: Self-pay | Admitting: Pulmonary Disease

## 2018-09-01 DIAGNOSIS — J439 Emphysema, unspecified: Secondary | ICD-10-CM | POA: Diagnosis not present

## 2018-09-01 DIAGNOSIS — J9611 Chronic respiratory failure with hypoxia: Secondary | ICD-10-CM | POA: Diagnosis not present

## 2018-09-01 DIAGNOSIS — J849 Interstitial pulmonary disease, unspecified: Secondary | ICD-10-CM | POA: Diagnosis not present

## 2018-09-02 ENCOUNTER — Other Ambulatory Visit: Payer: Self-pay | Admitting: Pulmonary Disease

## 2018-09-02 DIAGNOSIS — J849 Interstitial pulmonary disease, unspecified: Secondary | ICD-10-CM

## 2018-09-02 MED ORDER — BENZONATATE 200 MG PO CAPS: 200.0000 mg | ORAL_CAPSULE | Freq: Three times a day (TID) | ORAL | 0 refills | Status: DC | PRN

## 2018-09-02 NOTE — Telephone Encounter (Signed)
Aaron Edelman are you okay with refilling the tessalon for pt? He is requesting a 90 day supply  Thanks

## 2018-09-02 NOTE — Telephone Encounter (Signed)
Left detailed msg for [facility] letting her know that the rx was sent

## 2018-09-02 NOTE — Telephone Encounter (Signed)
Pt wife calling to check on the status of refill request for eariler this morning., please adviser can be reached @ [phone].[name]

## 2018-09-02 NOTE — Telephone Encounter (Signed)
Yes ok   B

## 2018-09-05 ENCOUNTER — Telehealth: Payer: Self-pay | Admitting: Pulmonary Disease

## 2018-09-05 DIAGNOSIS — J9611 Chronic respiratory failure with hypoxia: Secondary | ICD-10-CM

## 2018-09-05 DIAGNOSIS — J849 Interstitial pulmonary disease, unspecified: Secondary | ICD-10-CM

## 2018-09-05 NOTE — Telephone Encounter (Signed)
09/05/2018 0928  08/30/2018-overnight oximetry- time slept: 9 hours and 13 minutes, average SPO2 during that time was 80%, time spent below 89% 7 hours and 34 minutes, time spent between SPO2 of 70 and 80% is 2 hours and 10 minutes, longest continuous time with a saturation below 89% was 3 hours and 11 minutes, SaO2 low 69%.  Based off these results I would recommend the patient start 2 L of O2 at night.  We likely will have to repeat an overnight oximetry on 2L o2 to further evaluate patient's oxygen levels.  Ok to place order for nighttime O2 2L, and also place order for ONO on 2L o2 in 2-4 weeks.   Wyn Quaker FNP

## 2018-09-05 NOTE — Telephone Encounter (Signed)
Called and spoke with pt letting him know the results of the ONO and stated to him that he will need to wear O2 at 2L at night based on the results. Stated to pt that we were going to have him repeat the ONO in 2-4 weeks so we can make sure that the 2L will be enough or to see if he would need more than 2L at night and pt verbalized understanding. Order has been placed for the O2 at night and also placed order for pt to have ONO repeated in 2-4 weeks. Nothing further needed.

## 2018-09-12 ENCOUNTER — Other Ambulatory Visit: Payer: Self-pay

## 2018-09-12 ENCOUNTER — Ambulatory Visit: Payer: Medicare Other | Admitting: Pulmonary Disease

## 2018-09-12 ENCOUNTER — Ambulatory Visit (INDEPENDENT_AMBULATORY_CARE_PROVIDER_SITE_OTHER): Payer: Medicare Other | Admitting: Pulmonary Disease

## 2018-09-12 ENCOUNTER — Encounter: Payer: Self-pay | Admitting: Pulmonary Disease

## 2018-09-12 DIAGNOSIS — J849 Interstitial pulmonary disease, unspecified: Secondary | ICD-10-CM

## 2018-09-12 DIAGNOSIS — J449 Chronic obstructive pulmonary disease, unspecified: Secondary | ICD-10-CM | POA: Diagnosis not present

## 2018-09-12 LAB — HEPATIC FUNCTION PANEL
ALT: 14 U/L (ref 0–53)
AST: 19 U/L (ref 0–37)
Albumin: 3.5 g/dL (ref 3.5–5.2)
Alkaline Phosphatase: 63 U/L (ref 39–117)
Bilirubin, Direct: 0.1 mg/dL (ref 0.0–0.3)
Total Bilirubin: 0.4 mg/dL (ref 0.2–1.2)
Total Protein: 6.7 g/dL (ref 6.0–8.3)

## 2018-09-12 MED ORDER — TRELEGY ELLIPTA 100-62.5-25 MCG/INH IN AEPB: 1.0000 | INHALATION_SPRAY | Freq: Every day | RESPIRATORY_TRACT | 0 refills | Status: AC

## 2018-09-12 MED ORDER — FLUTTER DEVI: 1.0000 | 0 refills | Status: AC

## 2018-09-12 NOTE — Progress Notes (Addendum)
[name]    [id number]    04/20/1945  Primary Care Physician:Hawkins, Percell Miller, MD  Referring Physician: Sinda Du, MD [address] [place],  [place] [address]  Chief complaint: Follow up for RA-ILD  HPI: 73 year old with history of COPD, rheumatoid arthritis, hepatitis, hyperlipidemia. Recently diagnosed with rheumatoid arthritis.  Follows with Dr Kathlene November, Rheumatology.  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  No mold, hot tub, Jacuzzi, humidifier Smoking history: 12.5-pack-year history.  Quit in 2009 Travel history: Previously lived in [place].  No significant recent travel Relevant family history: No significant family history of lung disease.  Interim history: Patient was started on Ofev in April of 2020 for progressive UIP fibrosis and RA-ILD.  He took it for about 2 months but stopped due to GI disturbance, loss of weight. Restarted off last month.  States that he is tolerating it better at this time  Was given levofloxacin at last visit for left upper lobe pneumonia.  Outpatient Encounter Medications as of 09/12/2018  Medication Sig  . amLODipine (NORVASC) 5 MG tablet Take 5 mg by mouth daily.  Marland Kitchen azaTHIOprine (IMURAN) 50 MG tablet Take 100 mg by mouth daily.  . benzonatate (TESSALON) 200 MG capsule Take 1 capsule (200 mg total) by mouth 3 (three) times daily as needed for cough.  . celecoxib (CELEBREX) 200 MG capsule Take 200 mg by mouth daily.  . Cholecalciferol (VITAMIN D3) 10 MCG (400 UNIT) tablet Take 400 Units by mouth daily.  . famotidine (PEPCID) 10 MG tablet Take 10 mg by mouth daily as needed for heartburn or indigestion.  . Fluticasone-Umeclidin-Vilant (TRELEGY ELLIPTA) 100-62.5-25 MCG/INH AEPB Inhale 1 puff into the lungs daily.  . hydroxychloroquine (PLAQUENIL) 200 MG tablet Take 400 mg by mouth daily.  Marland Kitchen ipratropium-albuterol  (DUONEB) 0.5-2.5 (3) MG/3ML SOLN Take 3 mLs by nebulization 4 (four) times daily.   . Lactobacillus (PROBIOTIC ACIDOPHILUS) CAPS Take 1 tablet by mouth daily.  Marland Kitchen PROAIR HFA 108 (90 BASE) MCG/ACT inhaler Inhale 2 puffs into the lungs 4 (four) times daily as needed for wheezing or shortness of breath.   . simvastatin (ZOCOR) 20 MG tablet Take 20 mg by mouth at bedtime.   . tamsulosin (FLOMAX) 0.4 MG CAPS capsule Take 0.4 mg by mouth daily.  . [DISCONTINUED] levofloxacin (LEVAQUIN) 500 MG tablet Take 1 tablet (500 mg total) by mouth daily.  . [DISCONTINUED] predniSONE (DELTASONE) 10 MG tablet 4 tabs for 2 days, then 3 tabs for 2 days, 2 tabs for 2 days, then 1 tab for 2 days, then stop   No facility-administered encounter medications on file as of 09/12/2018.    Physical Exam: Blood pressure (!) 142/80, pulse 99, temperature 98.6 F (37 C), height 5\' 11"  (1.803 m), weight 154 lb 9.6 oz (70.1 kg), SpO2 90 %. Gen:      No acute distress HEENT:  EOMI, sclera anicteric Neck:     No masses; no thyromegaly Lungs:    Clear to auscultation bilaterally; normal respiratory effort CV:         Regular rate and rhythm; no murmurs Abd:      + bowel sounds; soft, non-tender; no palpable masses, no distension Ext:    No edema; adequate peripheral perfusion Skin:      Warm and dry; no rash Neuro: alert and oriented x 3 Psych: normal mood and affect  Data Reviewed: Imaging: CT abdomen pelvis 10/27/2013- visualized lung bases show peripheral honeycombing with mild fibrosis, reticulation, architectural distortion.    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 Bullous emphysematous changes, new nodular lesion in the left upper lobe.  Enlarged pulmonary artery.  CT 07/04/2018-irregular nodule/consolidation in the left upper lobe with increase in size compared to previous.  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  PET  scan 07/11/2018- left upper lobe irregular pulmonary nodule is hypermetabolic.  There is also superimposed patchy consolidation throughout the apical left upper lobe.  Hypermetabolic AP window and left prevascular lymphadenopathy.  PFTs 02/15/2018 FVC 1.77 (46%), FEV1 1.49 (52%), F/F 84, TLC 44%, DLCO 33% Severe restriction, diffusion impairment.  FENO 02/15/2018- 19  Labs Hypersensitivity panel 02/15/2018-negative Alpha-1 antitrypsin 02/15/2018-161, PIMM  CTD serologies 02/15/2018- ANA negative, myositis panel-negative  Ro, La, SCL 70-Negative CCP 108, rheumatoid factor 54  Cardiac: Echocardiogram 08/22/2018-LVEF 60-65%.  Normal RV systolic function  Assessment:  Left upper lobe pneumonia Recent imaging reviewed with left upper lobe nodular consolidation and groundglass opacities suggestive of pneumonia for which he has received a course of levofloxacin. Although there was an uptake on PET scan this could be secondary to pneumonia and rheumatoid nodules can also be FDG avid on PET scan.  It is unclear to me if there is an underlying malignancy and he is at high risk for complications from biopsy given location of lesion and severe underlying interstitial lung disease  We will order follow-up CT to reevaluate.   RA-ILD Imaging shows progressive honeycombing, basilar fibrosis and UIP pattern.  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  Although he has exposure of asbestosis in the past this is less likely to be asbestosis.  Follow with rheumatology for management of rheumatoid arthritis.  Currently on azathioprine. Continue ofev.  Check hepatic function panel Qualify for portable concentrator.  Emphysema Continue Trelegy, nebs  Enlarged pulmonary artery on CT Echo reviewed with no evidence of pulmonary hypertension.  Plan/Recommendations: - Continue Trelegy inhaler, nebs - Order follow-up CT scan - Continue ofev. Check hepatic panel.   Marshell Garfinkel MD [facility] 09/12/2018, 1:46 PM  CC: Sinda Du, MD  Addendum: Received note from Dr. Kathlene November, [facility] dated 06/18/2018 Being followed for seropositive rheumatoid arthritis [CCP greater than 250]  Stopped Plaquenil as its not helping.   RA treatment is complicated by hepatitis C and ILD Holding azathioprine and other modalities to avoid immunosuppression until hepatitis C treatment is complete.  Patient has been referred to a hepatologist.  Methotrexate and leflunomide is contraindicated due to interstitial lung disease In the meantime he has received a prednisone taper.  Received note from Dr. Kathlene November, [facility] dated 07/29/2018 Patient is now on treatment for hepatitis C.  He is also been started on prednisone and azathioprine. Azathioprine uptitrated 200 mg daily and prednisone at 10 mg daily. If symptoms do not improve then may need additional DMARD such as rituximab or Actemra  Marshell Garfinkel MD [facility] 09/12/2018, 1:46 PM

## 2018-09-12 NOTE — Patient Instructions (Signed)
We will get a hepatic function panel today We will check if you are eligible for a portable concentrator  Continue the ofev and telegy inhaler We will give you flutter valve for clearance of secretion.  Use Mucinex over-the-counter Continue to use Trelegy inhaler We will need a high-resolution CT in 1 month Follow-up in 1 month.

## 2018-09-13 ENCOUNTER — Telehealth: Payer: Self-pay | Admitting: Pulmonary Disease

## 2018-09-13 NOTE — Telephone Encounter (Signed)
Called and spoke with [employer]. [place] requested oxygen liters for POC.  Patient was qualified on [place] at 09/12/18 OV with Dr Vaughan Browner.  Patient ambulated  with POC at 4 liters. Understanding stated. Nothing further at this time.

## 2018-09-16 ENCOUNTER — Telehealth: Payer: Self-pay | Admitting: Pulmonary Disease

## 2018-09-16 NOTE — Telephone Encounter (Signed)
Called and spoke to Ritchie at [employer]. She wanted order clarification. Patient is ordered 4 L when ambulation on POC and 2 L continuous at night per documentation in Epic from overnight oximetry and qualifying walk.  She verbalized understanding and will make sure the patient has what he needs. Nothing further needed at this time.

## 2018-09-19 ENCOUNTER — Telehealth: Payer: Self-pay | Admitting: Pulmonary Disease

## 2018-09-19 ENCOUNTER — Encounter: Payer: Self-pay | Admitting: Pulmonary Disease

## 2018-09-19 NOTE — Telephone Encounter (Signed)
Patient is returning phone call.  Patient phone number is [phone].

## 2018-09-19 NOTE — Telephone Encounter (Signed)
Spoke with patient. He is aware of results and verbalized understanding. Nothing further needed at time of call.

## 2018-09-19 NOTE — Telephone Encounter (Signed)
09/19/2018 0 858  Patient's overnight oximetry results have come in.  09/16/2018-overnight oximetry on 2 L of O2- time patient slept 6 hours and 15 minutes, time below 89% SPO2 2 minutes and 36 seconds, lowest SPO2 was 86%, mean SPO2 was 92.7%.  Patient should remain on 2 L of O2 at night.  No changes at this time.  Wyn Quaker FNP

## 2018-09-19 NOTE — Telephone Encounter (Signed)
Left message for patient to call back  

## 2018-09-20 ENCOUNTER — Telehealth: Payer: Self-pay | Admitting: Pulmonary Disease

## 2018-09-20 NOTE — Telephone Encounter (Signed)
Called the patient back and he was calling based on the number he saw on the caller ID dated 09/19/18. The patient was already contacted by our office regarding the results for his ONO. Patient apologized. Nothing further needed at this time.

## 2018-09-27 DIAGNOSIS — K740 Hepatic fibrosis: Secondary | ICD-10-CM | POA: Diagnosis not present

## 2018-10-01 DIAGNOSIS — Z23 Encounter for immunization: Secondary | ICD-10-CM | POA: Diagnosis not present

## 2018-10-02 DIAGNOSIS — J439 Emphysema, unspecified: Secondary | ICD-10-CM | POA: Diagnosis not present

## 2018-10-02 DIAGNOSIS — J849 Interstitial pulmonary disease, unspecified: Secondary | ICD-10-CM | POA: Diagnosis not present

## 2018-10-02 DIAGNOSIS — J9611 Chronic respiratory failure with hypoxia: Secondary | ICD-10-CM | POA: Diagnosis not present

## 2018-10-03 ENCOUNTER — Other Ambulatory Visit: Payer: Self-pay

## 2018-10-03 ENCOUNTER — Ambulatory Visit (HOSPITAL_COMMUNITY)
Admission: RE | Admit: 2018-10-03 | Discharge: 2018-10-03 | Disposition: A | Discharge status: Home / Self Care | Payer: Medicare Other | Source: Ambulatory Visit | Attending: Pulmonary Disease | Admitting: Pulmonary Disease

## 2018-10-03 DIAGNOSIS — J9809 Other diseases of bronchus, not elsewhere classified: Secondary | ICD-10-CM | POA: Diagnosis not present

## 2018-10-03 DIAGNOSIS — J479 Bronchiectasis, uncomplicated: Secondary | ICD-10-CM | POA: Diagnosis not present

## 2018-10-03 DIAGNOSIS — J849 Interstitial pulmonary disease, unspecified: Secondary | ICD-10-CM | POA: Insufficient documentation

## 2018-10-04 ENCOUNTER — Telehealth: Payer: Self-pay | Admitting: Pulmonary Disease

## 2018-10-04 DIAGNOSIS — J849 Interstitial pulmonary disease, unspecified: Secondary | ICD-10-CM

## 2018-10-04 NOTE — Telephone Encounter (Signed)
Called and spoke w/ pt's wife, [name] (on [place]). [name] states pt has a smaller tank O2 4L in a bag to carry around; however, that is does not sustain for long and pt is dropping to 88% while ambulating. [name] further notes a rep from [place] came out one day and stated pt probably needed to be on 6L O2 and an order would need to be placed.   [name] states pt does not have a POC even though an order has been placed to [facility] to provide pt with POC on 09/05/2018. She further states pt would like to receive an Inogen tank, if possible.   I attempted to call [facility] respiratory therapy department to verify this information and verify the order was placed; however, their line went to voicemail and I had to leave a message to call our office back.   Since Dr. Vaughan Browner is not currently in the office, I am routing this message to [facility] [place]. Aaron Edelman please advise with your recommendations. If anything, we could set pt up for an appointment to see Dr. Vaughan Browner for next week.

## 2018-10-04 NOTE — Telephone Encounter (Signed)
Okay noted.  Okay to place order for 6 L pulsed via POC.  Patient would still need qualifying walk at DME office to justify and show that patient can maintain oxygen saturations greater than 88% on POC at 6 L pulsed.  Wyn Quaker, FNP

## 2018-10-04 NOTE — Telephone Encounter (Signed)
DME order placed to [facility] for Patient to have  qualifying walk on 6 liter pulsed POC.  Nothing further at this time.

## 2018-10-04 NOTE — Telephone Encounter (Signed)
Called and spoke with [name], [place].  Danae Chen stated a rep tried to qualify him on a POC and Patient's sats dropped below 88% on 5 liters. Danae Chen stated they have POC's that go up to 6 liters pulse dose, but that is the highest for a pulse  POC. Danae Chen stated they like a provider to be aware of and request new POC order for  POC  6 liters.  Message routed to [facility]

## 2018-10-04 NOTE — Telephone Encounter (Signed)
[facility] returning call and can be reached @ [phone].[name]

## 2018-10-04 NOTE — Telephone Encounter (Signed)
I am fine with the patient receiving indigent tank.  Likely though the patient would need to purchase this out-of-pocket.  Sometimes the cost can be between $2000 and $3000.Likely the patient is on a wait list to receive a POC.  They will need to check with the DME company.  If the patient needs a larger tank for portable O2 then we can write a prescription for that.Wyn Quaker, FNP

## 2018-10-05 DIAGNOSIS — M542 Cervicalgia: Secondary | ICD-10-CM | POA: Diagnosis not present

## 2018-10-05 DIAGNOSIS — R51 Headache: Secondary | ICD-10-CM | POA: Diagnosis not present

## 2018-10-05 DIAGNOSIS — Z87891 Personal history of nicotine dependence: Secondary | ICD-10-CM | POA: Diagnosis not present

## 2018-10-05 DIAGNOSIS — M79622 Pain in left upper arm: Secondary | ICD-10-CM | POA: Diagnosis not present

## 2018-10-05 DIAGNOSIS — D72829 Elevated white blood cell count, unspecified: Secondary | ICD-10-CM | POA: Diagnosis not present

## 2018-10-05 DIAGNOSIS — K219 Gastro-esophageal reflux disease without esophagitis: Secondary | ICD-10-CM | POA: Diagnosis not present

## 2018-10-05 DIAGNOSIS — J189 Pneumonia, unspecified organism: Secondary | ICD-10-CM | POA: Diagnosis not present

## 2018-10-05 DIAGNOSIS — H538 Other visual disturbances: Secondary | ICD-10-CM | POA: Diagnosis not present

## 2018-10-05 DIAGNOSIS — J984 Other disorders of lung: Secondary | ICD-10-CM | POA: Diagnosis not present

## 2018-10-05 DIAGNOSIS — M79621 Pain in right upper arm: Secondary | ICD-10-CM | POA: Diagnosis not present

## 2018-10-05 DIAGNOSIS — I10 Essential (primary) hypertension: Secondary | ICD-10-CM | POA: Diagnosis not present

## 2018-10-05 DIAGNOSIS — Z79899 Other long term (current) drug therapy: Secondary | ICD-10-CM | POA: Diagnosis not present

## 2018-10-05 DIAGNOSIS — Z8673 Personal history of transient ischemic attack (TIA), and cerebral infarction without residual deficits: Secondary | ICD-10-CM | POA: Diagnosis not present

## 2018-10-05 DIAGNOSIS — J449 Chronic obstructive pulmonary disease, unspecified: Secondary | ICD-10-CM | POA: Diagnosis not present

## 2018-10-07 DIAGNOSIS — J984 Other disorders of lung: Secondary | ICD-10-CM | POA: Diagnosis not present

## 2018-10-07 DIAGNOSIS — R918 Other nonspecific abnormal finding of lung field: Secondary | ICD-10-CM | POA: Diagnosis not present

## 2018-10-07 DIAGNOSIS — Z87891 Personal history of nicotine dependence: Secondary | ICD-10-CM | POA: Diagnosis not present

## 2018-10-07 DIAGNOSIS — J449 Chronic obstructive pulmonary disease, unspecified: Secondary | ICD-10-CM | POA: Diagnosis not present

## 2018-10-07 DIAGNOSIS — J9611 Chronic respiratory failure with hypoxia: Secondary | ICD-10-CM | POA: Diagnosis not present

## 2018-10-10 ENCOUNTER — Other Ambulatory Visit: Payer: Self-pay | Admitting: *Deleted

## 2018-10-10 DIAGNOSIS — J849 Interstitial pulmonary disease, unspecified: Secondary | ICD-10-CM

## 2018-10-17 DIAGNOSIS — K5730 Diverticulosis of large intestine without perforation or abscess without bleeding: Secondary | ICD-10-CM | POA: Diagnosis not present

## 2018-10-17 DIAGNOSIS — Z87891 Personal history of nicotine dependence: Secondary | ICD-10-CM | POA: Diagnosis not present

## 2018-10-17 DIAGNOSIS — I2510 Atherosclerotic heart disease of native coronary artery without angina pectoris: Secondary | ICD-10-CM | POA: Diagnosis not present

## 2018-10-17 DIAGNOSIS — R918 Other nonspecific abnormal finding of lung field: Secondary | ICD-10-CM | POA: Diagnosis not present

## 2018-10-17 DIAGNOSIS — J984 Other disorders of lung: Secondary | ICD-10-CM | POA: Diagnosis not present

## 2018-10-17 DIAGNOSIS — R911 Solitary pulmonary nodule: Secondary | ICD-10-CM | POA: Diagnosis not present

## 2018-10-17 DIAGNOSIS — J449 Chronic obstructive pulmonary disease, unspecified: Secondary | ICD-10-CM | POA: Diagnosis not present

## 2018-10-17 DIAGNOSIS — I700 Atherosclerosis of aorta: Secondary | ICD-10-CM | POA: Diagnosis not present

## 2018-10-17 DIAGNOSIS — N200 Calculus of kidney: Secondary | ICD-10-CM | POA: Diagnosis not present

## 2018-10-21 DIAGNOSIS — R911 Solitary pulmonary nodule: Secondary | ICD-10-CM | POA: Diagnosis not present

## 2018-10-25 ENCOUNTER — Other Ambulatory Visit (HOSPITAL_COMMUNITY)
Admission: RE | Admit: 2018-10-25 | Discharge: 2018-10-25 | Disposition: A | Discharge status: Home / Self Care | Payer: Medicare Other | Source: Ambulatory Visit | Attending: Pulmonary Disease | Admitting: Pulmonary Disease

## 2018-10-25 ENCOUNTER — Other Ambulatory Visit: Payer: Self-pay

## 2018-10-25 DIAGNOSIS — Z01812 Encounter for preprocedural laboratory examination: Secondary | ICD-10-CM | POA: Insufficient documentation

## 2018-10-25 DIAGNOSIS — Z20828 Contact with and (suspected) exposure to other viral communicable diseases: Secondary | ICD-10-CM | POA: Insufficient documentation

## 2018-10-25 LAB — SARS CORONAVIRUS 2 (TAT 6-24 HRS): SARS Coronavirus 2: NEGATIVE

## 2018-10-29 DIAGNOSIS — J9611 Chronic respiratory failure with hypoxia: Secondary | ICD-10-CM | POA: Diagnosis not present

## 2018-10-29 DIAGNOSIS — J849 Interstitial pulmonary disease, unspecified: Secondary | ICD-10-CM | POA: Diagnosis not present

## 2018-10-29 DIAGNOSIS — J479 Bronchiectasis, uncomplicated: Secondary | ICD-10-CM | POA: Diagnosis not present

## 2018-10-29 DIAGNOSIS — R911 Solitary pulmonary nodule: Secondary | ICD-10-CM | POA: Diagnosis not present

## 2018-10-29 DIAGNOSIS — J42 Unspecified chronic bronchitis: Secondary | ICD-10-CM | POA: Diagnosis not present

## 2018-10-29 DIAGNOSIS — I700 Atherosclerosis of aorta: Secondary | ICD-10-CM | POA: Diagnosis not present

## 2018-10-29 DIAGNOSIS — M069 Rheumatoid arthritis, unspecified: Secondary | ICD-10-CM | POA: Diagnosis not present

## 2018-10-29 DIAGNOSIS — I2510 Atherosclerotic heart disease of native coronary artery without angina pectoris: Secondary | ICD-10-CM | POA: Diagnosis not present

## 2018-10-29 DIAGNOSIS — K5730 Diverticulosis of large intestine without perforation or abscess without bleeding: Secondary | ICD-10-CM | POA: Diagnosis not present

## 2018-10-29 DIAGNOSIS — N200 Calculus of kidney: Secondary | ICD-10-CM | POA: Diagnosis not present

## 2018-10-29 DIAGNOSIS — R001 Bradycardia, unspecified: Secondary | ICD-10-CM | POA: Diagnosis not present

## 2018-11-01 DIAGNOSIS — J9611 Chronic respiratory failure with hypoxia: Secondary | ICD-10-CM | POA: Diagnosis not present

## 2018-11-01 DIAGNOSIS — J849 Interstitial pulmonary disease, unspecified: Secondary | ICD-10-CM | POA: Diagnosis not present

## 2018-11-01 DIAGNOSIS — J439 Emphysema, unspecified: Secondary | ICD-10-CM | POA: Diagnosis not present

## 2018-11-04 DIAGNOSIS — M549 Dorsalgia, unspecified: Secondary | ICD-10-CM | POA: Diagnosis not present

## 2018-11-04 DIAGNOSIS — M79673 Pain in unspecified foot: Secondary | ICD-10-CM | POA: Diagnosis not present

## 2018-11-04 DIAGNOSIS — M0579 Rheumatoid arthritis with rheumatoid factor of multiple sites without organ or systems involvement: Secondary | ICD-10-CM | POA: Diagnosis not present

## 2018-11-04 DIAGNOSIS — M7989 Other specified soft tissue disorders: Secondary | ICD-10-CM | POA: Diagnosis not present

## 2018-11-04 DIAGNOSIS — M79643 Pain in unspecified hand: Secondary | ICD-10-CM | POA: Diagnosis not present

## 2018-11-07 ENCOUNTER — Telehealth: Payer: Self-pay | Admitting: Pulmonary Disease

## 2018-11-07 DIAGNOSIS — C3412 Malignant neoplasm of upper lobe, left bronchus or lung: Secondary | ICD-10-CM | POA: Diagnosis not present

## 2018-11-07 DIAGNOSIS — Z87891 Personal history of nicotine dependence: Secondary | ICD-10-CM | POA: Diagnosis not present

## 2018-11-07 DIAGNOSIS — J849 Interstitial pulmonary disease, unspecified: Secondary | ICD-10-CM

## 2018-11-07 DIAGNOSIS — R911 Solitary pulmonary nodule: Secondary | ICD-10-CM | POA: Diagnosis not present

## 2018-11-07 NOTE — Telephone Encounter (Signed)
Spoke with patient. States still having a cough with mucus and he can't get the phlegm up .   He would like another Rx for tessalon pearls.  Dr. Vaughan Browner please advise

## 2018-11-08 NOTE — Telephone Encounter (Signed)
Per patient's chart, it appears that he is being followed by a pulmonologist at [facility] as well.   Left message with patient to verify if he still wants to remain a patient here or if he is in the process of switching over to [facility].

## 2018-11-12 DIAGNOSIS — Z510 Encounter for antineoplastic radiation therapy: Secondary | ICD-10-CM | POA: Diagnosis not present

## 2018-11-12 DIAGNOSIS — C3412 Malignant neoplasm of upper lobe, left bronchus or lung: Secondary | ICD-10-CM | POA: Diagnosis not present

## 2018-11-12 MED ORDER — BENZONATATE 200 MG PO CAPS: 200.0000 mg | ORAL_CAPSULE | Freq: Three times a day (TID) | ORAL | 0 refills | Status: AC | PRN

## 2018-11-12 NOTE — Telephone Encounter (Signed)
OK to refill

## 2018-11-12 NOTE — Addendum Note (Signed)
Addended by: Amado Coe on: 11/12/2018 02:08 PM   Modules accepted: Orders

## 2018-11-12 NOTE — Telephone Encounter (Signed)
Pt's wife calling back for benzonatate to filled.  Please advise.  [phone].

## 2018-11-12 NOTE — Telephone Encounter (Signed)
Spoke with patient. Let him know medication was sent to [facility] understanding Nothing further needed at this time.

## 2018-11-13 DIAGNOSIS — C3412 Malignant neoplasm of upper lobe, left bronchus or lung: Secondary | ICD-10-CM | POA: Diagnosis not present

## 2018-11-13 DIAGNOSIS — Z510 Encounter for antineoplastic radiation therapy: Secondary | ICD-10-CM | POA: Diagnosis not present

## 2018-11-28 DIAGNOSIS — Z510 Encounter for antineoplastic radiation therapy: Secondary | ICD-10-CM | POA: Diagnosis not present

## 2018-11-28 DIAGNOSIS — C3412 Malignant neoplasm of upper lobe, left bronchus or lung: Secondary | ICD-10-CM | POA: Diagnosis not present

## 2018-12-02 DIAGNOSIS — J849 Interstitial pulmonary disease, unspecified: Secondary | ICD-10-CM | POA: Diagnosis not present

## 2018-12-02 DIAGNOSIS — J9611 Chronic respiratory failure with hypoxia: Secondary | ICD-10-CM | POA: Diagnosis not present

## 2018-12-02 DIAGNOSIS — J439 Emphysema, unspecified: Secondary | ICD-10-CM | POA: Diagnosis not present

## 2018-12-03 DIAGNOSIS — C3412 Malignant neoplasm of upper lobe, left bronchus or lung: Secondary | ICD-10-CM | POA: Diagnosis not present

## 2018-12-03 DIAGNOSIS — Z510 Encounter for antineoplastic radiation therapy: Secondary | ICD-10-CM | POA: Diagnosis not present

## 2018-12-05 DIAGNOSIS — C3412 Malignant neoplasm of upper lobe, left bronchus or lung: Secondary | ICD-10-CM | POA: Diagnosis not present

## 2018-12-05 DIAGNOSIS — Z510 Encounter for antineoplastic radiation therapy: Secondary | ICD-10-CM | POA: Diagnosis not present

## 2018-12-09 DIAGNOSIS — Z510 Encounter for antineoplastic radiation therapy: Secondary | ICD-10-CM | POA: Diagnosis not present

## 2018-12-09 DIAGNOSIS — C3412 Malignant neoplasm of upper lobe, left bronchus or lung: Secondary | ICD-10-CM | POA: Diagnosis not present

## 2018-12-11 DIAGNOSIS — C3412 Malignant neoplasm of upper lobe, left bronchus or lung: Secondary | ICD-10-CM | POA: Diagnosis not present

## 2018-12-11 DIAGNOSIS — Z510 Encounter for antineoplastic radiation therapy: Secondary | ICD-10-CM | POA: Diagnosis not present

## 2018-12-13 DIAGNOSIS — C3412 Malignant neoplasm of upper lobe, left bronchus or lung: Secondary | ICD-10-CM | POA: Diagnosis not present

## 2018-12-13 DIAGNOSIS — Z510 Encounter for antineoplastic radiation therapy: Secondary | ICD-10-CM | POA: Diagnosis not present

## 2018-12-18 DIAGNOSIS — M79643 Pain in unspecified hand: Secondary | ICD-10-CM | POA: Diagnosis not present

## 2018-12-18 DIAGNOSIS — M549 Dorsalgia, unspecified: Secondary | ICD-10-CM | POA: Diagnosis not present

## 2018-12-18 DIAGNOSIS — M7989 Other specified soft tissue disorders: Secondary | ICD-10-CM | POA: Diagnosis not present

## 2018-12-18 DIAGNOSIS — M79673 Pain in unspecified foot: Secondary | ICD-10-CM | POA: Diagnosis not present

## 2018-12-18 DIAGNOSIS — M0579 Rheumatoid arthritis with rheumatoid factor of multiple sites without organ or systems involvement: Secondary | ICD-10-CM | POA: Diagnosis not present

## 2018-12-23 DIAGNOSIS — J9611 Chronic respiratory failure with hypoxia: Secondary | ICD-10-CM | POA: Diagnosis not present

## 2018-12-23 DIAGNOSIS — J449 Chronic obstructive pulmonary disease, unspecified: Secondary | ICD-10-CM | POA: Diagnosis not present

## 2018-12-23 DIAGNOSIS — M069 Rheumatoid arthritis, unspecified: Secondary | ICD-10-CM | POA: Diagnosis not present

## 2019-01-01 DIAGNOSIS — J439 Emphysema, unspecified: Secondary | ICD-10-CM | POA: Diagnosis not present

## 2019-01-01 DIAGNOSIS — J849 Interstitial pulmonary disease, unspecified: Secondary | ICD-10-CM | POA: Diagnosis not present

## 2019-01-01 DIAGNOSIS — J9611 Chronic respiratory failure with hypoxia: Secondary | ICD-10-CM | POA: Diagnosis not present

## 2019-02-05 ENCOUNTER — Telehealth: Payer: Self-pay | Admitting: Pulmonary Disease

## 2019-02-05 NOTE — Telephone Encounter (Signed)
Called to initiate PA  PA for ofev approved from 01/24/19 until 01/23/20  Nothing further needed

## 2019-04-01 DIAGNOSIS — J439 Emphysema, unspecified: Secondary | ICD-10-CM | POA: Diagnosis not present

## 2019-04-01 DIAGNOSIS — J9611 Chronic respiratory failure with hypoxia: Secondary | ICD-10-CM | POA: Diagnosis not present

## 2019-04-01 DIAGNOSIS — J849 Interstitial pulmonary disease, unspecified: Secondary | ICD-10-CM | POA: Diagnosis not present

## 2019-04-09 ENCOUNTER — Ambulatory Visit (HOSPITAL_COMMUNITY): Payer: Medicare Other

## 2019-04-17 DIAGNOSIS — C3412 Malignant neoplasm of upper lobe, left bronchus or lung: Secondary | ICD-10-CM | POA: Diagnosis not present

## 2019-04-17 DIAGNOSIS — Z79899 Other long term (current) drug therapy: Secondary | ICD-10-CM | POA: Diagnosis not present

## 2019-04-17 DIAGNOSIS — M0579 Rheumatoid arthritis with rheumatoid factor of multiple sites without organ or systems involvement: Secondary | ICD-10-CM | POA: Diagnosis not present

## 2019-04-17 DIAGNOSIS — Z23 Encounter for immunization: Secondary | ICD-10-CM | POA: Diagnosis not present

## 2019-04-17 DIAGNOSIS — I119 Hypertensive heart disease without heart failure: Secondary | ICD-10-CM | POA: Diagnosis not present

## 2019-04-17 DIAGNOSIS — J849 Interstitial pulmonary disease, unspecified: Secondary | ICD-10-CM | POA: Diagnosis not present

## 2019-04-17 DIAGNOSIS — E559 Vitamin D deficiency, unspecified: Secondary | ICD-10-CM | POA: Diagnosis not present

## 2019-08-13 ENCOUNTER — Ambulatory Visit
Admission: RE | Admit: 2019-08-13 | Discharge: 2019-08-13 | Disposition: A | Discharge status: Home / Self Care | Payer: Medicare Other | Source: Ambulatory Visit | Attending: Family | Admitting: Family

## 2019-08-13 ENCOUNTER — Other Ambulatory Visit: Payer: Self-pay | Admitting: Family

## 2019-08-13 DIAGNOSIS — R062 Wheezing: Secondary | ICD-10-CM

## 2019-08-13 DIAGNOSIS — R0989 Other specified symptoms and signs involving the circulatory and respiratory systems: Secondary | ICD-10-CM

## 2019-08-21 ENCOUNTER — Other Ambulatory Visit (HOSPITAL_COMMUNITY): Payer: Self-pay | Admitting: Family

## 2019-08-21 DIAGNOSIS — I509 Heart failure, unspecified: Secondary | ICD-10-CM

## 2019-08-26 ENCOUNTER — Telehealth: Payer: Self-pay | Admitting: Internal Medicine

## 2019-08-26 NOTE — Telephone Encounter (Signed)
Spoke with patient's wife regarding Palliative services and she was in agreement with scheduling visit.  I have scheduled an In-person Consult for 09/08/19 @ 1 PM
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
## 2019-09-08 ENCOUNTER — Encounter: Payer: Self-pay | Admitting: Internal Medicine

## 2019-09-08 ENCOUNTER — Other Ambulatory Visit: Payer: Medicare Other | Admitting: Internal Medicine

## 2019-09-08 ENCOUNTER — Other Ambulatory Visit: Payer: Self-pay

## 2019-09-08 DIAGNOSIS — Z7189 Other specified counseling: Secondary | ICD-10-CM

## 2019-09-08 DIAGNOSIS — Z515 Encounter for palliative care: Secondary | ICD-10-CM

## 2019-09-10 ENCOUNTER — Other Ambulatory Visit (HOSPITAL_COMMUNITY): Payer: Medicare Other

## 2019-09-11 ENCOUNTER — Telehealth (HOSPITAL_COMMUNITY): Payer: Self-pay | Admitting: Family

## 2019-09-11 NOTE — Telephone Encounter (Signed)
Patients wife cancelled appt with patient engagement rep. And states thathis PCP wanted him to cancel due to O2 stats getting too low when going out. She will call later to reschedule. Order will be removed and when they call to reschedule we will reinstate the order.

## 2019-09-12 ENCOUNTER — Other Ambulatory Visit (HOSPITAL_COMMUNITY): Payer: Medicare Other

## 2019-09-22 ENCOUNTER — Other Ambulatory Visit (HOSPITAL_COMMUNITY): Payer: Medicare Other

## 2019-09-24 ENCOUNTER — Ambulatory Visit (HOSPITAL_COMMUNITY): Payer: Medicare Other | Attending: Cardiovascular Disease

## 2019-09-24 ENCOUNTER — Other Ambulatory Visit: Payer: Self-pay

## 2019-09-24 DIAGNOSIS — I509 Heart failure, unspecified: Secondary | ICD-10-CM | POA: Insufficient documentation

## 2019-09-24 DIAGNOSIS — R0602 Shortness of breath: Secondary | ICD-10-CM | POA: Diagnosis not present

## 2019-09-24 LAB — ECHOCARDIOGRAM COMPLETE
Area-P 1/2: 3.37 cm2
S' Lateral: 2.2 cm

## 2019-10-08 ENCOUNTER — Telehealth: Payer: Self-pay

## 2019-10-08 NOTE — Telephone Encounter (Signed)
NOTES ON FILE FROM [phone] SENT REFERRAL TO SCHEDULING

## 2019-10-29 ENCOUNTER — Other Ambulatory Visit: Payer: Medicare Other | Admitting: Nurse Practitioner

## 2019-10-29 ENCOUNTER — Other Ambulatory Visit: Payer: Self-pay

## 2019-10-29 DIAGNOSIS — Z515 Encounter for palliative care: Secondary | ICD-10-CM

## 2019-10-29 DIAGNOSIS — J9611 Chronic respiratory failure with hypoxia: Secondary | ICD-10-CM
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
## 2019-11-05 ENCOUNTER — Other Ambulatory Visit: Payer: Self-pay | Admitting: Family

## 2019-11-05 ENCOUNTER — Ambulatory Visit
Admission: RE | Admit: 2019-11-05 | Discharge: 2019-11-05 | Disposition: A | Discharge status: Home / Self Care | Payer: Medicare Other | Source: Ambulatory Visit | Attending: Family | Admitting: Family

## 2019-11-05 DIAGNOSIS — R0989 Other specified symptoms and signs involving the circulatory and respiratory systems: Secondary | ICD-10-CM

## 2019-11-06 ENCOUNTER — Ambulatory Visit: Payer: Medicare Other | Admitting: Cardiology

## 2019-11-11 ENCOUNTER — Other Ambulatory Visit: Payer: Self-pay

## 2019-11-11 ENCOUNTER — Other Ambulatory Visit: Payer: Medicare Other | Admitting: Nurse Practitioner

## 2019-11-11 DIAGNOSIS — B029 Zoster without complications: Secondary | ICD-10-CM

## 2019-11-11 DIAGNOSIS — Z515 Encounter for palliative care: Secondary | ICD-10-CM
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
## 2019-11-28 ENCOUNTER — Ambulatory Visit: Payer: Medicare Other | Admitting: Cardiology

## 2019-12-31 ENCOUNTER — Other Ambulatory Visit: Payer: Self-pay

## 2019-12-31 ENCOUNTER — Encounter: Admitting: Cardiology

## 2019-12-31 ENCOUNTER — Encounter: Payer: Self-pay | Admitting: Cardiology

## 2019-12-31 VITALS — BP 108/88 | HR 84 | Ht 71.0 in | Wt 170.2 lb

## 2019-12-31 DIAGNOSIS — R079 Chest pain, unspecified: Secondary | ICD-10-CM

## 2019-12-31 NOTE — Progress Notes (Signed)
  Patient arrived for OV after cancelling months ago.  Since the initial appt, patient was evaluated by pulmonary and felt to be terminal by consult at [facility]. He is on 8L O2. He is now in Hospice/Palliative care and DNR with comfort measures.  Discussed with referring provider who states consult is no longer needed and this was explained to patient and his wife and they will followup with PCP.   This encounter was created in error - please disregard.

## 2020-02-24 DEATH — deceased
